# Patient Record
Sex: Male | Born: 1965 | Race: Black or African American | Hispanic: No | Marital: Married | Smoking: Former smoker
Health system: Southern US, Community
[De-identification: ages and names within clinical notes are randomized; demographics above are authoritative.]

## PROBLEM LIST (undated history)

## (undated) DIAGNOSIS — Z923 Personal history of irradiation: Secondary | ICD-10-CM

## (undated) DIAGNOSIS — R079 Chest pain, unspecified: Secondary | ICD-10-CM

## (undated) DIAGNOSIS — I10 Essential (primary) hypertension: Secondary | ICD-10-CM

## (undated) DIAGNOSIS — R002 Palpitations: Secondary | ICD-10-CM

## (undated) DIAGNOSIS — K219 Gastro-esophageal reflux disease without esophagitis: Secondary | ICD-10-CM

## (undated) DIAGNOSIS — C189 Malignant neoplasm of colon, unspecified: Secondary | ICD-10-CM

## (undated) DIAGNOSIS — J45909 Unspecified asthma, uncomplicated: Secondary | ICD-10-CM

---

## 2009-12-03 ENCOUNTER — Encounter: Payer: Self-pay | Admitting: Cardiovascular Disease

## 2010-05-24 ENCOUNTER — Encounter: Payer: Self-pay | Admitting: Cardiovascular Disease

## 2010-06-28 DIAGNOSIS — R079 Chest pain, unspecified: Secondary | ICD-10-CM

## 2010-06-28 DIAGNOSIS — R002 Palpitations: Secondary | ICD-10-CM

## 2010-06-28 DIAGNOSIS — R9431 Abnormal electrocardiogram [ECG] [EKG]: Secondary | ICD-10-CM | POA: Insufficient documentation

## 2010-06-28 HISTORY — DX: Palpitations: R00.2

## 2010-06-28 HISTORY — DX: Chest pain, unspecified: R07.9

## 2010-08-03 ENCOUNTER — Ambulatory Visit: Payer: Self-pay | Admitting: Cardiovascular Disease

## 2010-08-16 ENCOUNTER — Encounter: Payer: Self-pay | Admitting: Cardiovascular Disease

## 2010-08-16 ENCOUNTER — Ambulatory Visit (HOSPITAL_COMMUNITY): Admission: RE | Admit: 2010-08-16 | Discharge: 2010-08-16 | Payer: Self-pay | Admitting: Cardiovascular Disease

## 2010-08-16 ENCOUNTER — Ambulatory Visit: Payer: Self-pay | Admitting: Internal Medicine

## 2010-08-16 ENCOUNTER — Ambulatory Visit: Payer: Self-pay

## 2010-12-28 NOTE — Letter (Signed)
Summary: [facility]  [facility]   Imported By: Debby Freiberg 06/28/2010 11:00:26  _____________________________________________________________________  External Attachment:    Type:   Image     Comment:   External Document

## 2010-12-28 NOTE — Assessment & Plan Note (Signed)
Summary: NP3/ABN EKG/CHST PAIN   CC:  referal from Dr. Beverely Pace abnormal EKG.  History of Present Illness: Darren Hill is an engaging 45 yo blk male referred by Dr Beverely Pace for atypical sharp SSCP, palpitaoitns and abnormal ECG.  I reviewed his ECG form [facility] medical and the urgent care and they are essentially normal with minor T wave changes.  His SSCP was atypical and ppt by eating too much pepperment.  It is resolved.  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  No exertional pain and no risk factors.  He is interested in a career change either in cooking, cutting hair or automotive.  No associated diaphoresis, dyspnea syncope or edema.    Preventive Screening-Counseling & Management  Alcohol-Tobacco     Smoking Status: never      Drug Use:  no.    Current Problems (verified): 1)  Palpitations  (ICD-785.1) 2)  Chest Pain  (ICD-786.50) 3)  Abnormal Electrocardiogram  (ICD-794.31)  Current Medications (verified): 1)  None  Allergies (verified): No Known Drug Allergies  Past History:  Past Medical History: Last updated: 06/28/2010 PALPITATIONS CHEST PAIN that comes and goes...Marland KitchenMarland KitchenMarland Kitchenworse with deep breathe when present ABNORMAL ELECTROCARDIOGRAM   Family History: Last updated: 08/03/2010 DM  Social History: Last updated: 08/03/2010 Full Time Married  Tobacco Use - No.  Alcohol Use - no Drug Use - no  Past Surgical History: leg  Family History: DM  Social History: Full Time Married  Tobacco Use - No.  Alcohol Use - no Drug Use - no Smoking Status:  never Drug Use:  no  Review of Systems       Denies fever, malais, weight loss, blurry vision, decreased visual acuity, cough, sputum, SOB, hemoptysis, pleuritic pain, palpitaitons, heartburn, abdominal pain, melena, lower extremity edema, claudication, or rash.   Vital Signs:  Patient profile:   45 year old male Height:      69 inches Weight:      182 pounds BMI:     26.97 Pulse rate:   87 / minute Resp:     14  per minute BP sitting:   132 / 87  (left arm)  Vitals Entered By: Kem Parkinson (August 03, 2010 2:09 PM)  Physical Exam  General:  Affect appropriate Healthy:  appears stated age HEENT: normal Neck supple with no adenopathy JVP normal no bruits no thyromegaly Lungs clear with no wheezing and good diaphragmatic motion Heart:  S1/S2 no murmur,rub, gallop or click PMI normal Abdomen: benighn, BS positve, no tenderness, no AAA no bruit.  No HSM or HJR Distal pulses intact with no bruits No edema Neuro non-focal Skin warm and dry    Impression & Recommendations:  Problem # 1:  PALPITATIONS (ICD-785.1) Benign and no need for further w/u Orders: Echocardiogram (Echo)  Problem # 2:  CHEST PAIN (ICD-786.50) Atypical likely related to gi agravation from peppermint  No need for ETT  Problem # 3:  ABNORMAL ELECTROCARDIOGRAM (ICD-794.31) Minor changes.  F/U echo to assess LV/RV function  Patient Instructions: 1)  Your physician recommends that you schedule a follow-up appointment as needed. 2)  Your physician recommends that you continue on your current medications as directed. Please refer to the Current Medication list given to you today. 3)  Your physician has requested that you have an echocardiogram.  Echocardiography is a painless test that uses sound waves to create images of your heart. It provides your doctor with information about the size and shape of your heart and how  well your heart's chambers and valves are working.  This procedure takes approximately one hour. There are no restrictions for this procedure.   EKG Report  Procedure date:  07/08/2010  Findings:      NSR Nonspecific ST/T wave changes

## 2010-12-28 NOTE — Letter (Signed)
Summary: Encounter Report  Encounter Report   Imported By: Marylou Mccoy 10/26/2010 13:17:17  _____________________________________________________________________  External Attachment:    Type:   Image     Comment:   External Document

## 2011-03-05 ENCOUNTER — Emergency Department (HOSPITAL_COMMUNITY)
Admission: EM | Admit: 2011-03-05 | Discharge: 2011-03-05 | Disposition: A | Discharge status: Home / Self Care | Payer: PRIVATE HEALTH INSURANCE | Attending: Emergency Medicine | Admitting: Emergency Medicine

## 2011-03-05 ENCOUNTER — Emergency Department (HOSPITAL_COMMUNITY): Payer: PRIVATE HEALTH INSURANCE

## 2011-03-05 DIAGNOSIS — R079 Chest pain, unspecified: Secondary | ICD-10-CM | POA: Insufficient documentation

## 2011-03-05 LAB — CBC
HCT: 40.9 % (ref 39.0–52.0)
Hemoglobin: 14.5 g/dL (ref 13.0–17.0)
MCHC: 35.5 g/dL (ref 30.0–36.0)
RBC: 4.75 MIL/uL (ref 4.22–5.81)

## 2011-03-05 LAB — POCT I-STAT, CHEM 8
BUN: 10 mg/dL (ref 6–23)
Creatinine, Ser: 1.8 mg/dL — ABNORMAL HIGH (ref 0.4–1.5)
Hemoglobin: 14.6 g/dL (ref 13.0–17.0)
Potassium: 4.4 mEq/L (ref 3.5–5.1)
Sodium: 137 mEq/L (ref 135–145)

## 2011-03-05 LAB — DIFFERENTIAL
Basophils Absolute: 0 10*3/uL (ref 0.0–0.1)
Lymphocytes Relative: 46 % (ref 12–46)
Monocytes Absolute: 0.4 10*3/uL (ref 0.1–1.0)
Neutro Abs: 2.1 10*3/uL (ref 1.7–7.7)
Neutrophils Relative %: 44 % (ref 43–77)

## 2012-10-29 DIAGNOSIS — R7989 Other specified abnormal findings of blood chemistry: Secondary | ICD-10-CM | POA: Insufficient documentation

## 2014-04-01 ENCOUNTER — Other Ambulatory Visit: Payer: Self-pay | Admitting: Gastroenterology

## 2014-04-01 NOTE — Addendum Note (Signed)
Addended by: Hong Timm on: 04/01/2014 02:47 PM   Modules accepted: Orders  

## 2014-04-03 ENCOUNTER — Encounter (HOSPITAL_COMMUNITY)
Admission: RE | Disposition: A | Discharge status: Home / Self Care | Payer: Commercial Managed Care - PPO | Source: Ambulatory Visit | Attending: Gastroenterology

## 2014-04-03 ENCOUNTER — Ambulatory Visit (HOSPITAL_COMMUNITY)
Admission: RE | Admit: 2014-04-03 | Discharge: 2014-04-03 | Disposition: A | Discharge status: Home / Self Care | Payer: Commercial Managed Care - PPO | Source: Ambulatory Visit | Attending: Gastroenterology | Admitting: Gastroenterology

## 2014-04-03 ENCOUNTER — Encounter (HOSPITAL_COMMUNITY): Payer: Self-pay

## 2014-04-03 DIAGNOSIS — C20 Malignant neoplasm of rectum: Secondary | ICD-10-CM

## 2014-04-03 DIAGNOSIS — C189 Malignant neoplasm of colon, unspecified: Secondary | ICD-10-CM

## 2014-04-03 HISTORY — PX: COLON BIOPSY: SHX1369

## 2014-04-03 HISTORY — PX: EUS: SHX5427

## 2014-04-03 HISTORY — DX: Essential (primary) hypertension: I10

## 2014-04-03 HISTORY — DX: Malignant neoplasm of colon, unspecified: C18.9

## 2014-04-03 SURGERY — ULTRASOUND, LOWER GI TRACT, ENDOSCOPIC
Anesthesia: Moderate Sedation

## 2014-04-03 MED ORDER — FENTANYL CITRATE 0.05 MG/ML IJ SOLN
INTRAMUSCULAR | Status: AC
  Filled 2014-04-03: qty 2

## 2014-04-03 MED ORDER — MIDAZOLAM HCL 5 MG/ML IJ SOLN
INTRAMUSCULAR | Status: AC
  Filled 2014-04-03: qty 2

## 2014-04-03 MED ORDER — FENTANYL CITRATE 0.05 MG/ML IJ SOLN
INTRAMUSCULAR | Status: DC | PRN
  Administered 2014-04-03: 50 ug via INTRAVENOUS
  Administered 2014-04-03: 25 ug via INTRAVENOUS

## 2014-04-03 MED ORDER — MIDAZOLAM HCL 10 MG/2ML IJ SOLN
INTRAMUSCULAR | Status: DC | PRN
  Administered 2014-04-03 (×3): 2 mg via INTRAVENOUS

## 2014-04-03 MED ORDER — SODIUM CHLORIDE 0.9 % IV SOLN
INTRAVENOUS | Status: DC
  Administered 2014-04-03: 500 mL via INTRAVENOUS

## 2014-04-03 NOTE — Discharge Instructions (Signed)
Endorectal ultrasound ° °Post procedure instructions: ° °Read the instructions outlined below and refer to this sheet in the next few weeks. These discharge instructions provide you with general information on caring for yourself after you leave the hospital. Your doctor may also give you specific instructions. While your treatment has been planned according to the most current medical practices available, unavoidable complications occasionally occur. If you have any problems or questions after discharge, call Dr. Zarrah Loveland at [facility] ([phone]). ° °HOME CARE INSTRUCTIONS ° °ACTIVITY: °· You may resume your regular activity, but move at a slower pace for the next 24 hours.  °· Take frequent rest periods for the next 24 hours.  °· Walking will help get rid of the air and reduce the bloated feeling in your belly (abdomen).  °· No driving for 24 hours (because of the medicine (anesthesia) used during the test).  °· You may shower.  °· Do not sign any important legal documents or operate any machinery for 24 hours (because of the anesthesia used during the test).  °NUTRITION: °· Drink plenty of fluids.  °· You may resume your normal diet as instructed by your doctor.  °· Begin with a light meal and progress to your normal diet. Heavy or fried foods are harder to digest and may make you feel sick to your stomach (nauseated).  °· Avoid alcoholic beverages for 24 hours or as instructed.  °MEDICATIONS: °· You may resume your normal medications unless your doctor tells you otherwise.  °WHAT TO EXPECT TODAY: °· Some feelings of bloating in the abdomen.  °· Passage of more gas than usual.  °· Spotting of blood in your stool or on the toilet paper.  °IF YOU HAD POLYPS REMOVED DURING THE COLONOSCOPY: °· No aspirin products for 7 days or as instructed.  °· No alcohol for 7 days or as instructed.  °· Eat a soft diet for the next 24 hours.  ° °FINDING OUT THE RESULTS OF YOUR TEST ° °Not all test results are available  during your visit. If your test results are not back during the visit, make an appointment with your caregiver to find out the results. Do not assume everything is normal if you have not heard from your caregiver or the medical facility. It is important for you to follow up on all of your test results.  ° ° ° °SEEK IMMEDIATE MEDICAL CARE IF: ° °· You have more than a spotting of blood in your stool.  °· Your belly is swollen (abdominal distention).  °· You are nauseated or vomiting.  °· You have a fever.  °· You have abdominal pain or discomfort that is severe or gets worse throughout the day.  ° ° °Document Released: 06/28/2004 Document Revised: 07/27/2011 Document Reviewed: 06/26/2008 °ExitCare® Patient Information ©2012 [employer]. ° °

## 2014-04-03 NOTE — Op Note (Signed)
[facility] [place], [id number]   OPERATIVE PROCEDURE REPORT  PATIENT: Unnamed, Zeien  MR#: [id number] BIRTHDATE: 06/14/1966  GENDER: Male ENDOSCOPIST: Arta Silence, MD REFERRED BY:  Teena Irani, M.D. PROCEDURE DATE:  04/03/2014 PROCEDURE:   Flexible sigmoidoscopy EUS ASA CLASS:   Class II INDICATIONS:1.  rectal cancer. MEDICATIONS: Fentanyl 75 mcg IV and Versed 6 mg IV  DESCRIPTION OF PROCEDURE:   After the risks benefits and alternatives of the procedure were thoroughly explained, informed consent was obtained.  Throughout the procedure, the patients blood pressure, pulse and oxygen saturations were monitored continuously. Under direct visualization, the radial forward-viewing echoendoscope was introduced through the anus  and advanced to the sigmoid colon .  Water was used as necessary to provide an acoustic interface.  Imaging was obtained at 7.5 and 12Mhz. Upon completion of the imaging, water was removed and the patient was sent to the recovery room in satisfactory condition.    FINDINGS:   Normal digital rectal exam; could not palpate the mass. Mass readily seen endoscopically, is firm, fixed, fibrotic, friable, with central ulceration, located 10 - 15 cm from the anal verge.  Tattoo marking to identify the lesion's location was noted. Lesion appears posterior in location via ultrasound.  The mass is maximally about 3cm in diameter and less than 2cm thick.  Much of the lesion is confined to the rectal wall, but there is an approximately 30mm segment, corresponding endoscopically to the central ulceration, where the tumor penetrates through the muscularis propria.  There is no peritumoral adenopathy.  The lesion does not appear to invade into any neighboring organs.  STAGING: Posteriorally-located rectal cancer.  Locoregional staging by endorectal ultrasound is T3 N0 Mx.  RECOMMENDATIONS: 1.  Watch for potential  complications of procedure. 2.  Patient needs CT scan chest/abdomen/pelvis with contrast to complete staging evaluation. 3.  Based on ultrasound results alone, patient will need chemoradiative therapy prior to consideration of surgical intervention. 4.  Will discuss with Dr. Amedeo Plenty.   _______________________________ Lorrin MaisArta Silence, MD 04/03/2014 9:49 AM   CC:

## 2014-04-03 NOTE — H&P (Signed)
Patient interval history reviewed.  Patient examined again.  There has been no change from documented H/P dated 03/04/14 (scanned into chart from our office) except as documented above.  Assessment:  1.  Rectal mass, biopsy-proven adenocarcinoma.  Plan:  1.  Endorectal ultrasound for locoregional staging. 2.  Will ultimately need CT chest/abd/pelvis to complete staging evaluation. 3.  Risks (bleeding, infection, bowel perforation that could require surgery, sedation-related changes in cardiopulmonary systems), benefits (identification and possible treatment of source of symptoms, exclusion of certain causes of symptoms), and alternatives (watchful waiting, radiographic imaging studies, empiric medical treatment) of endorectal ultrasound (RUS) were explained to patient/family in detail and patient wishes to proceed.

## 2014-04-04 ENCOUNTER — Inpatient Hospital Stay: Admit: 2014-04-04 | Payer: Commercial Managed Care - PPO

## 2014-04-04 ENCOUNTER — Telehealth: Payer: Self-pay | Admitting: *Deleted

## 2014-04-04 ENCOUNTER — Encounter (HOSPITAL_COMMUNITY): Payer: Self-pay | Admitting: Gastroenterology

## 2014-04-04 ENCOUNTER — Other Ambulatory Visit: Payer: Self-pay | Admitting: Gastroenterology

## 2014-04-04 ENCOUNTER — Ambulatory Visit
Admission: RE | Admit: 2014-04-04 | Discharge: 2014-04-04 | Disposition: A | Discharge status: Home / Self Care | Payer: Commercial Managed Care - PPO | Source: Ambulatory Visit

## 2014-04-04 ENCOUNTER — Ambulatory Visit
Admission: RE | Admit: 2014-04-04 | Discharge: 2014-04-04 | Disposition: A | Discharge status: Home / Self Care | Payer: Commercial Managed Care - PPO | Source: Ambulatory Visit | Attending: Gastroenterology | Admitting: Gastroenterology

## 2014-04-04 DIAGNOSIS — C20 Malignant neoplasm of rectum: Secondary | ICD-10-CM

## 2014-04-04 MED ORDER — IOHEXOL 300 MG/ML  SOLN
100.0000 mL | Freq: Once | INTRAMUSCULAR | Status: AC | PRN
  Administered 2014-04-04: 100 mL via INTRAVENOUS

## 2014-04-04 NOTE — Telephone Encounter (Signed)
Spoke with patient by phone and gave appointments for Dr. Lisbeth Renshaw and Dr. Benay Spice.  Contact names, numbers, and directions were provided.

## 2014-04-04 NOTE — Progress Notes (Signed)
GU Location of Tumor / Histology: Rectal  Patient presented months ago with signs/symptoms of: blood uiin stools going on for less than  2 months  Biopsies of  (if applicable) revealed: Colonoscopy  With biopsy 04/03/14   : Colon, rectal mass biopsy= Adenocarcinoma   Dr. Arta Silence,  Past/Anticipated interventions by urology, if any:   colonoscopy 03/31/14 with biopsy Dr. Teena Irani  Past/Anticipated interventions by medical oncology, if any: Dr. Benay Spice seen 04/10/14, chemo education and Pamala Hurry Nef appts 04/15/14, Dr. Johney Maine 04/23/14  return appt with Dr.Sherrill  05/05/14  Weight changes, if any: no  Bowel/Bladder complaints, if any: rectal bleeding with bowel movements bid, regular formed  Nausea/Vomiting, if any: no  Pain issues, if any:  no  SAFETY ISSUES: No  Prior radiation? No  Pacemaker/ICD?  No   Is the patient on methotrexate? No  Current Complaints / other details: Truck driver, was in [employer],  Former smoker cigarettes quit 1779,TJ alcohol or illicit drug use  Hx of abnormal heart rhythm   Mother DM, Maternal aunt deceased age 17 breast ca,

## 2014-04-04 NOTE — Telephone Encounter (Signed)
[facility] spoke with Ailene Ravel asking for path from colonoscopy done 03/31/14, "It's not back yetwill fax when it is ready" 1:47 PM

## 2014-04-09 ENCOUNTER — Ambulatory Visit
Admission: RE | Admit: 2014-04-09 | Discharge: 2014-04-09 | Disposition: A | Discharge status: Home / Self Care | Payer: Commercial Managed Care - PPO | Source: Ambulatory Visit | Attending: Radiation Oncology | Admitting: Radiation Oncology

## 2014-04-09 ENCOUNTER — Ambulatory Visit: Payer: Commercial Managed Care - PPO

## 2014-04-09 DIAGNOSIS — C20 Malignant neoplasm of rectum: Secondary | ICD-10-CM | POA: Insufficient documentation

## 2014-04-09 DIAGNOSIS — Z510 Encounter for antineoplastic radiation therapy: Secondary | ICD-10-CM | POA: Insufficient documentation

## 2014-04-10 ENCOUNTER — Encounter: Payer: Self-pay | Admitting: Oncology

## 2014-04-10 ENCOUNTER — Ambulatory Visit (HOSPITAL_BASED_OUTPATIENT_CLINIC_OR_DEPARTMENT_OTHER): Payer: Commercial Managed Care - PPO | Admitting: Oncology

## 2014-04-10 ENCOUNTER — Telehealth: Payer: Self-pay | Admitting: Oncology

## 2014-04-10 ENCOUNTER — Ambulatory Visit: Payer: Commercial Managed Care - PPO

## 2014-04-10 VITALS — BP 125/81 | HR 86 | Temp 97.0°F | Resp 20 | Ht 69.0 in | Wt 183.2 lb

## 2014-04-10 DIAGNOSIS — C772 Secondary and unspecified malignant neoplasm of intra-abdominal lymph nodes: Secondary | ICD-10-CM

## 2014-04-10 DIAGNOSIS — I10 Essential (primary) hypertension: Secondary | ICD-10-CM

## 2014-04-10 DIAGNOSIS — C20 Malignant neoplasm of rectum: Secondary | ICD-10-CM

## 2014-04-10 NOTE — Telephone Encounter (Signed)
s.w. pt and advised on NUT appt 5.19.15.Marland KitchenMarland Kitchenpt ok adn aware

## 2014-04-10 NOTE — Telephone Encounter (Signed)
gv and printed appt sched and avs for pt for May June.Darren KitchenMarland Kitchenpt wanted late afternoon appt...done

## 2014-04-10 NOTE — Progress Notes (Signed)
Checked in new pt with no financial concerns. °

## 2014-04-10 NOTE — Progress Notes (Signed)
Met with Darren Hill and family. Explained role of nurse navigator. Educational information provided on colorectal cancer  Referral made to dietician for diet education. [place] resources provided to patient, including SW service and support group information.  Contact names and phone numbers were provided for entire [facility] team.  Teach back method was used.  No barriers to care identified.  Will continue to follow as needed.

## 2014-04-10 NOTE — Progress Notes (Signed)
[facility] Patient Consult   Referring MD: Missy Sabins, Md [address], [place], [place] [address]   Darren Hill 48 y.o.  12-25-1965    Reason for Referral: Rectal cancer   HPI: He reports blood in the stool for a few weeks and he was referred to Dr. Amedeo Plenty. He had noted a small amount of blood per rectum in 2014. He otherwise feels well.  He was taken to a colonoscopy by Dr. Amedeo Plenty on 03/31/2014. A sessile nonobstructing mass was found in the proximal rectum from 12-15 cm from the anal verge. Oozing was present. A biopsy was obtained. The area was tattooed. The exam was otherwise normal. The pathology ([id number]) revealed adenocarcinoma.  Dr. Paulita Fujita performed an endoscopic ultrasound 04/03/2012. The mass was measured at 10-15 cm from the anal verge. The mass was largely confined to the rectal wall, but an approximate 15 mm segment corresponded to the area of central ulceration where tumor was noted to penetrate through the muscularis propria. No peritumoral adenopathy. The tumor was staged as an ultrasound T3 N0 lesion.  He underwent staging CTs of the chest, abdomen, and pelvis on 04/04/2014. No suspicious pulmonary nodule. The liver appeared normal. An intraluminal mass was noted in the superior rectum measuring approximately 2.1 x 2.9 cm. No evidence of perirectal or other pelvic lymphadenopathy. No abdominal lymphadenopathy.    Past Medical History  Diagnosis Date  . Hypertension     Past Surgical History  Procedure Laterality Date  . Eus N/A 04/03/2014    Procedure: LOWER ENDOSCOPIC ULTRASOUND (EUS);  Surgeon: Arta Silence, MD;  Location: [facility] ENDOSCOPY;  Service: Endoscopy;  Laterality: N/A;  H/P in file cabinet ja    Medications: Reviewed  Allergies: No Known Allergies  Family history: No family history of cancer. He has 4 sisters.  Social History:   He lives in [place]. He works as a Administrator. He quit smoking cigarettes 8  years ago. He does not use alcohol. He was in the [employer]. No risk factor for HIV or hepatitis.  History  Alcohol Use No    History  Smoking status  . Former Smoker  . Quit date: 04/03/2006  Smokeless tobacco  . Not on file      ROS:   Positives include: Rectal bleeding with bowel movements  A complete ROS was otherwise negative.  Physical Exam:  Blood pressure 125/81, pulse 86, temperature 97 F (36.1 C), temperature source Oral, resp. rate 20, height 5\' 9"  (1.753 m), weight 183 lb 3.2 oz (83.099 kg), SpO2 100.00%.  HEENT: Oropharynx without visible mass, neck without mass Lungs: Clear bilaterally Cardiac: Regular rate and rhythm Abdomen: No hepatosplenomegaly, nontender, no mass GU: Testes without mass  Vascular: No leg edema Lymph nodes: No cervical, supra-clavicular, axillary, or inguinal nodes Neurologic: Alert and oriented, the motor exam appears intact in the upper and lower extremities Skin: No rash Musculoskeletal: No spine tenderness   LAB: CBC, chemistry panel, and CEA to be obtained on the day of the chemotherapy teaching class  Imaging:  As per history of present illness   Assessment/Plan:   1. Clinical stage II Rectal cancer, posterior rectal mass identified at 10-15 cm from the anal verge, a biopsy 03/31/2014 confirmed adenocarcinoma  Endoscopic ultrasound 04/03/2014 revealed a (uT3,uN0) lesion 2. Hypertension   Disposition:   Mr. Keeter has been diagnosed with adenocarcinoma of the rectum. I discussed the diagnosis and treatment options with Mr. Chasteen and his wife. He appears  to have localized disease. The standard of care is to proceed with neoadjuvant chemotherapy and radiation to be followed by surgery.  We will refer him to Dr. Lisbeth Renshaw and Dr. Johney Maine.  I recommend neoadjuvant capecitabine and radiation. We discussed the potential toxicities associated with capecitabine including the chance for mucositis, diarrhea, and hematologic toxicity. We  reviewed the rash, hyperpigmentation, and hand/foot syndrome associated with capecitabine. We also discussed increased sun sensitivity and the likelihood of a radiation/chemotherapy skin reaction. He agrees to proceed.  The plan is to begin combined modality therapy during the week of 04/21/2014. Mr. Bhardwaj will return for an office and lab visit 05/05/2014.  Approximately 50 minutes were spent with patient today. The majority of the time was used for counseling and coordination of care.  Ladell Pier 04/10/2014, 10:58 AM

## 2014-04-11 ENCOUNTER — Other Ambulatory Visit: Payer: Self-pay | Admitting: *Deleted

## 2014-04-11 MED ORDER — CAPECITABINE 500 MG PO TABS: ORAL_TABLET | ORAL | Status: DC

## 2014-04-14 ENCOUNTER — Encounter: Payer: Self-pay | Admitting: Radiation Oncology

## 2014-04-14 ENCOUNTER — Ambulatory Visit
Admission: RE | Admit: 2014-04-14 | Discharge: 2014-04-14 | Disposition: A | Discharge status: Home / Self Care | Payer: Commercial Managed Care - PPO | Source: Ambulatory Visit | Attending: Radiation Oncology | Admitting: Radiation Oncology

## 2014-04-14 ENCOUNTER — Other Ambulatory Visit: Payer: Self-pay | Admitting: *Deleted

## 2014-04-14 ENCOUNTER — Encounter: Payer: Self-pay | Admitting: Oncology

## 2014-04-14 VITALS — BP 120/78 | HR 84 | Temp 97.6°F | Resp 20 | Ht 69.0 in | Wt 184.0 lb

## 2014-04-14 DIAGNOSIS — C20 Malignant neoplasm of rectum: Secondary | ICD-10-CM

## 2014-04-14 HISTORY — DX: Malignant neoplasm of colon, unspecified: C18.9

## 2014-04-14 MED ORDER — CAPECITABINE 500 MG PO TABS: ORAL_TABLET | ORAL | Status: DC

## 2014-04-14 NOTE — Progress Notes (Signed)
Faxed xeloda prescription to [facility] °

## 2014-04-14 NOTE — Progress Notes (Signed)
Per patient maternal aunt had brain tumor not breast cancer, deceased, addendum

## 2014-04-14 NOTE — Progress Notes (Signed)
Please see the Nurse Progress Note in the MD Initial Consult Encounter for this patient. 

## 2014-04-15 ENCOUNTER — Other Ambulatory Visit (HOSPITAL_BASED_OUTPATIENT_CLINIC_OR_DEPARTMENT_OTHER): Payer: Commercial Managed Care - PPO

## 2014-04-15 ENCOUNTER — Encounter: Payer: Self-pay | Admitting: *Deleted

## 2014-04-15 ENCOUNTER — Ambulatory Visit: Payer: Commercial Managed Care - PPO | Admitting: Nutrition

## 2014-04-15 ENCOUNTER — Other Ambulatory Visit: Payer: Commercial Managed Care - PPO

## 2014-04-15 DIAGNOSIS — C20 Malignant neoplasm of rectum: Secondary | ICD-10-CM

## 2014-04-15 LAB — CBC WITH DIFFERENTIAL/PLATELET
BASO%: 1 % (ref 0.0–2.0)
Basophils Absolute: 0.1 10*3/uL (ref 0.0–0.1)
EOS%: 4.3 % (ref 0.0–7.0)
Eosinophils Absolute: 0.3 10*3/uL (ref 0.0–0.5)
HCT: 40.2 % (ref 38.4–49.9)
HGB: 13.2 g/dL (ref 13.0–17.1)
LYMPH%: 43.1 % (ref 14.0–49.0)
MCH: 28.8 pg (ref 27.2–33.4)
MCHC: 32.9 g/dL (ref 32.0–36.0)
MCV: 87.5 fL (ref 79.3–98.0)
MONO#: 0.5 10*3/uL (ref 0.1–0.9)
MONO%: 8.5 % (ref 0.0–14.0)
NEUT#: 2.7 10*3/uL (ref 1.5–6.5)
NEUT%: 43.1 % (ref 39.0–75.0)
PLATELETS: 255 10*3/uL (ref 140–400)
RBC: 4.59 10*6/uL (ref 4.20–5.82)
RDW: 12.6 % (ref 11.0–14.6)
WBC: 6.3 10*3/uL (ref 4.0–10.3)
lymph#: 2.7 10*3/uL (ref 0.9–3.3)

## 2014-04-15 LAB — COMPREHENSIVE METABOLIC PANEL (CC13)
ALBUMIN: 3.9 g/dL (ref 3.5–5.0)
ALT: 9 U/L (ref 0–55)
AST: 17 U/L (ref 5–34)
Alkaline Phosphatase: 48 U/L (ref 40–150)
Anion Gap: 10 mEq/L (ref 3–11)
BUN: 13.4 mg/dL (ref 7.0–26.0)
CALCIUM: 9.3 mg/dL (ref 8.4–10.4)
CHLORIDE: 104 meq/L (ref 98–109)
CO2: 25 mEq/L (ref 22–29)
Creatinine: 1.4 mg/dL — ABNORMAL HIGH (ref 0.7–1.3)
Glucose: 100 mg/dl (ref 70–140)
POTASSIUM: 3.9 meq/L (ref 3.5–5.1)
Sodium: 139 mEq/L (ref 136–145)
Total Bilirubin: 0.34 mg/dL (ref 0.20–1.20)
Total Protein: 7 g/dL (ref 6.4–8.3)

## 2014-04-15 NOTE — Progress Notes (Signed)
Radiation Oncology         [phone] ________________________________  Name: Darren Hill MRN: [id number]  Date: 04/14/2014  DOB: [birth year]  CC:Pcp Not In System  Darren Pier, MD     REFERRING PHYSICIAN: Ladell Pier, MD   DIAGNOSIS: The encounter diagnosis was Rectal cancer.   HISTORY OF PRESENT ILLNESS::Darren Hill is a 48 y.o. male who is seen for an initial consultation visit. The patient indicates he noticed blood in his stool for quite a while. He was unable to give a specific time frame but feels it has been somewhat of a chronic issue. The patient was referred to GI medicine and a colonoscopy was performed on 03/31/2014. A sessile non-obstructing mass was present within the proximal rectum at the 12-15 cm location from the anal verge. Some bleeding was present. A biopsy was obtained and pathology has revealed adenocarcinoma.  The patient proceeded to undergo an endoscopic ultrasound on 04/03/2014. This showed a mass which was firm and friable with central ulceration. This was located at the 10-15 cm distance from the anal verge. Endoscopically this corresponded to a T3 N0 tumor.  The patient has undergone CT scans of the chest abdomen and pelvis on 04/04/2014. No sign of pulmonary disease. A mass was present within the proximal rectum measuring 2.9 cm. No pelvic or abdominal lymphadenopathy was present.  The patient has seen Dr. Benay Hill in medical oncology and I have been asked to see the patient for consideration of radiation treatment as part of his overall treatment plan. The patient is also scheduled to see Dr. Johney Hill next week regarding surgery.    PREVIOUS RADIATION THERAPY: No   PAST MEDICAL HISTORY:  has a past medical history of Hypertension and Colon cancer (04/03/14).     PAST SURGICAL HISTORY: Past Surgical History  Procedure Laterality Date  . Eus N/A 04/03/2014    Procedure: LOWER ENDOSCOPIC ULTRASOUND (EUS);  Surgeon: Darren Silence, MD;  Location:  [facility] ENDOSCOPY;  Service: Endoscopy;  Laterality: N/A;  H/P in file cabinet ja  . Colon biopsy N/A 04/03/14    rectal mass=adenocarcinoma     FAMILY HISTORY: family history includes Brain cancer in his maternal aunt.   SOCIAL HISTORY:  reports that he quit smoking about 8 years ago. His smoking use included Cigarettes. He has a 3.75 pack-year smoking history. He has never used smokeless tobacco. He reports that he does not drink alcohol or use illicit drugs.   ALLERGIES: Review of patient's allergies indicates no known allergies.   MEDICATIONS:  Current Outpatient Prescriptions  Medication Sig Dispense Refill  . lisinopril (PRINIVIL,ZESTRIL) 10 MG tablet Take 10 mg by mouth daily.      . capecitabine (XELODA) 500 MG tablet Take 4 tablets (= 2000 mg) in the AM; Take 3 tablets (=1500 mg) in the PM.  Total daily dose of 3500 mg.  Take on days of Radiation Only.  140 tablet  0   No current facility-administered medications for this encounter.     REVIEW OF SYSTEMS:  A 15 point review of systems is documented in the electronic medical record. This was obtained by the nursing staff. However, I reviewed this with the patient to discuss relevant findings and make appropriate changes.  Pertinent items are noted in HPI.    PHYSICAL EXAM:  height is 5\' 9"  (1.753 m) and weight is 184 lb (83.462 kg). His oral temperature is 97.6 F (36.4 C). His blood pressure is 120/78 and his pulse is 84. His  respiration is 20.   ECOG = 1  0 - Asymptomatic (Fully active, able to carry on all predisease activities without restriction)  1 - Symptomatic but completely ambulatory (Restricted in physically strenuous activity but ambulatory and able to carry out work of a light or sedentary nature. For example, light housework, office work)  2 - Symptomatic, <50% in bed during the day (Ambulatory and capable of all self care but unable to carry out any work activities. Up and about more than 50% of waking hours)  3  - Symptomatic, >50% in bed, but not bedbound (Capable of only limited self-care, confined to bed or chair 50% or more of waking hours)  4 - Bedbound (Completely disabled. Cannot carry on any self-care. Totally confined to bed or chair)  5 - Death   Darren Hill MM, Darren Hill, Darren Hill, et al. [phone]). "Toxicity and response criteria of the [facility]". Darren Hill. 5 (6): 649-55  General: Well-developed, in no acute distress HEENT: Normocephalic, atraumatic; oral cavity clear Neck: Supple without any lymphadenopathy Cardiovascular: Regular rate and rhythm Respiratory: Clear to auscultation bilaterally GI: Soft, nontender, normal bowel sounds Extremities: No edema present Neuro: No focal deficits Rectal:  No external lesions. No rectal mass palpated. No blood on exam glove.    LABORATORY DATA:  Lab Results  Component Value Date   WBC 4.8 03/05/2011   HGB 14.6 03/05/2011   HCT 43.0 03/05/2011   MCV 86.1 03/05/2011   PLT 241 03/05/2011   Lab Results  Component Value Date   NA 137 03/05/2011   K 4.4 03/05/2011   CL 106 03/05/2011   No results found for this basename: ALT, AST, GGT, ALKPHOS, BILITOT      RADIOGRAPHY: Ct Chest W Contrast  04/04/2014   CLINICAL DATA:  Newly diagnosed rectal carcinoma.  EXAM: CT CHEST, ABDOMEN, AND PELVIS WITH CONTRAST  TECHNIQUE: Multidetector CT imaging of the chest, abdomen and pelvis was performed following the standard protocol during bolus administration of intravenous contrast.  CONTRAST:  147mL OMNIPAQUE IOHEXOL 300 MG/ML  SOLN  COMPARISON:  None.  FINDINGS: CT CHEST FINDINGS  No evidence of mediastinal or hilar masses. No lymphadenopathy seen elsewhere within the thorax. No evidence of pleural or pericardial effusion.  No suspicious pulmonary nodules or masses are identified. Mild scarring noted in the inferior aspect of the lingula. No evidence of pulmonary airspace disease or central endobronchial lesion. No evidence of chest wall  mass or suspicious bone lesions.  CT ABDOMEN AND PELVIS FINDINGS  The liver, gallbladder, pancreas, spleen, adrenal glands, and kidneys are normal in appearance. No evidence of hydronephrosis.  An intraluminal mass is seen in the superior rectum measuring approximately 2.1 x 2.9 cm on image 103. No evidence of perirectal or other pelvic lymphadenopathy. No evidence of abdominal lymphadenopathy. No other soft tissue masses identified. No evidence of bowel obstruction.  No evidence of inflammatory process or abnormal fluid collections. No suspicious bone lesions identified.  IMPRESSION: 2.9 cm intraluminal mass in the superior rectum, consistent with known rectal carcinoma.  No evidence of local lymphadenopathy or distant metastatic disease.   Electronically Signed   By: Earle Gell M.D.   On: 04/04/2014 18:13   Ct Abdomen Pelvis W Contrast  04/04/2014   CLINICAL DATA:  Newly diagnosed rectal carcinoma.  EXAM: CT CHEST, ABDOMEN, AND PELVIS WITH CONTRAST  TECHNIQUE: Multidetector CT imaging of the chest, abdomen and pelvis was performed following the standard protocol during bolus administration of intravenous  contrast.  CONTRAST:  177mL OMNIPAQUE IOHEXOL 300 MG/ML  SOLN  COMPARISON:  None.  FINDINGS: CT CHEST FINDINGS  No evidence of mediastinal or hilar masses. No lymphadenopathy seen elsewhere within the thorax. No evidence of pleural or pericardial effusion.  No suspicious pulmonary nodules or masses are identified. Mild scarring noted in the inferior aspect of the lingula. No evidence of pulmonary airspace disease or central endobronchial lesion. No evidence of chest wall mass or suspicious bone lesions.  CT ABDOMEN AND PELVIS FINDINGS  The liver, gallbladder, pancreas, spleen, adrenal glands, and kidneys are normal in appearance. No evidence of hydronephrosis.  An intraluminal mass is seen in the superior rectum measuring approximately 2.1 x 2.9 cm on image 103. No evidence of perirectal or other pelvic  lymphadenopathy. No evidence of abdominal lymphadenopathy. No other soft tissue masses identified. No evidence of bowel obstruction.  No evidence of inflammatory process or abnormal fluid collections. No suspicious bone lesions identified.  IMPRESSION: 2.9 cm intraluminal mass in the superior rectum, consistent with known rectal carcinoma.  No evidence of local lymphadenopathy or distant metastatic disease.   Electronically Signed   By: Earle Gell M.D.   On: 04/04/2014 18:13       IMPRESSION: The patient is a pleasant 48 year old gentleman with a new diagnosis of adenocarcinoma of the rectum. On endoscopic ultrasound this was present beginning 10 cm from the anal verge and represents a T3, N0, M0 tumor. The patient is presenting with a favorable case. In reviewing the patient's endoscopy report and CT imaging, I would consider this a proximal rectal tumor which would likely benefit from neoadjuvant chemoradiation treatment. The patient is young and I would favor aggressive treatment in his case.  I therefore discussed with the patient a potential 5-1/2 week course of radiation treatment with concurrent chemotherapy. I discussed with him the rationale of this treatment prior to surgery. We discussed the expected improvement in local/regional control. He also discussed the possible side effects and risks of treatment. All of his questions were answered.   PLAN: The patient will be scheduled for a simulation in the near future such that we can begin treatment planning. He has organ scheduled for chemotherapy class and also is seeing the nutritionist in the near future. The patient also will see Dr. Johney Hill next week regarding surgery.    ________________________________   Jodelle Gross, MD, PhD   **Disclaimer: This note was dictated with voice recognition software. Similar sounding words can inadvertently be transcribed and this note may contain transcription errors which may not have been corrected  upon publication of note.**

## 2014-04-15 NOTE — Progress Notes (Signed)
Patient is a 48 year old male diagnosed with rectal cancer.  He is a patient of Dr. Benay Spice.  Past medical history includes hypertension.  Medications include Xeloda.  Labs were pending.  Height: 69 inches. Weight: 184 pounds May 18. Usual body weight: 185 pounds. BMI: 27.16.  Patient will receive oral chemotherapy along with radiation therapy.  He does not present with any nutrition problems.  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  He likes most foods.  Nutrition diagnosis: Food and nutrition related knowledge deficit related to new diagnosis of rectal cancer as evidenced by no prior need for nutrition related information.  Intervention: Patient educated to consume 3 meals with snacks in between to promote weight maintenance.  Educated patient on high-protein foods and encouraged patient to consume at meals and snacks.  Discussed snacks patient could have with him on on the go while he is working.  Briefly reviewed strategies if patient develops diarrhea.  Provided fact sheets on increasing calories and protein and diarrhea.  Questions were answered.  Teach back method used.  Monitoring, evaluation, goals: Patient will tolerate adequate calories and protein for weight maintenance throughout treatment.    Next visit: Patient will contact me for followup if needed.

## 2014-04-15 NOTE — Progress Notes (Signed)
RECEIVED A FAX FROM [facility]ERNING A NOTICE OF RX TRANSFER FOR CAPECITABINE TO [facility]. PHARMACY PHONE #[phone]/FAX [phone].

## 2014-04-16 LAB — CEA: CEA: 1.2 ng/mL (ref 0.0–5.0)

## 2014-04-23 ENCOUNTER — Ambulatory Visit (INDEPENDENT_AMBULATORY_CARE_PROVIDER_SITE_OTHER): Payer: Commercial Managed Care - PPO | Admitting: Surgery

## 2014-04-23 ENCOUNTER — Encounter (INDEPENDENT_AMBULATORY_CARE_PROVIDER_SITE_OTHER): Payer: Self-pay | Admitting: Surgery

## 2014-04-23 ENCOUNTER — Ambulatory Visit
Admission: RE | Admit: 2014-04-23 | Discharge: 2014-04-23 | Disposition: A | Discharge status: Home / Self Care | Payer: Commercial Managed Care - PPO | Source: Ambulatory Visit | Attending: Radiation Oncology | Admitting: Radiation Oncology

## 2014-04-23 VITALS — BP 120/90 | HR 80 | Temp 97.2°F | Resp 16 | Ht 69.0 in | Wt 182.2 lb

## 2014-04-23 DIAGNOSIS — C20 Malignant neoplasm of rectum: Secondary | ICD-10-CM

## 2014-04-23 MED ORDER — METRONIDAZOLE 500 MG PO TABS: 500.0000 mg | ORAL_TABLET | ORAL | Status: DC

## 2014-04-23 MED ORDER — NEOMYCIN SULFATE 500 MG PO TABS: 1000.0000 mg | ORAL_TABLET | ORAL | Status: DC

## 2014-04-23 NOTE — Patient Instructions (Addendum)
Please consider the recommendations that we have given you today:  Usual benefit from neoadjuvant chemoradiation therapy to help shrink the rectal cancer.  8-12 weeks after completing neoadjuvant chemoradiation therapy, you will benefit from resection of the rectal cancer with an operation (LOW ANTERIOR RESECTION).  Plan robotic/minimally invasive approach.  See the Handout(s) we have given you.  Please call our office at [phone] if you wish to schedule surgery or if you have further questions / concerns.    Colorectal Cancer Colorectal cancer is an abnormal growth of tissue (tumor) in the colon or rectum that is cancerous (malignant). Unlike noncancerous (benign) tumors, malignant tumors can spread to other parts of your body. The colon is the large bowel or large intestine. The rectum is the last several inches of the colon.  RISK FACTORS The exact cause of colorectal cancer is unknown. However, the following factors may increase your chances of getting colorectal cancer:   Age older than 52 years.   Abnormal growths (polyps) on the inner wall of the colon or rectum.   Diabetes.   African American race.   Family history of hereditary nonpolyposis colorectal cancer. This condition is caused by changes in the genes that are responsible for repairing mismatched DNA.   Personal history of cancer. A person who has already had colorectal cancer may develop it a second time. Also, women with a history of ovarian, uterine, or breast cancer are at a somewhat higher risk of developing colorectal cancer.  Certain hereditary conditions.  Eating a diet that is high in fat (especially animal fat) and low in fiber, fruits, and vegetables.  Sedentary lifestyle.  Inflammatory bowel disease, including ulcerative colitis and Crohn disease.   Smoking.   Excessive alcohol use.  SYMPTOMS Early colorectal cancer often does not cause symptoms. As the cancer grows, symptoms may  include:   Changes in bowel habits.  Diarrhea.   Constipation.   Feeling like the bowel does not empty completely after a bowel movement.   Blood in the stool.   Stools that are narrower than usual.   Abdominal discomfort, pain, bloating, fullness, or cramps.  Frequent gas pain.   Unexplained weight loss.   Constant tiredness.   Nausea and vomiting.  DIAGNOSIS  Your health care provider will ask about your medical history. He or she may also perform a number of procedures, such as:   A physical exam.  A digital rectal exam.  A fecal occult blood test.  A barium enema.  Blood tests.   X-rays.   Imaging tests, such as CT scans or MRIs.   Taking a tissue sample (biopsy) from your colon or rectum to look for cancer cells.   A sigmoidoscopy to view the inside of the last part of your colon.   A colonoscopy to view the inside of your entire colon.   An endorectal ultrasound to see how deep a rectal tumor has grown and whether the cancer has spread to lymph nodes or other nearby tissues.  Your cancer will be staged to determine its severity and extent. Staging is a careful attempt to find out the size of the tumor, whether the cancer has spread, and if so, to what parts of the body. You may need to have more tests to determine the stage of your cancer. The test results will help determine what treatment plan is best for you.   Stage 0 The cancer is found only in the innermost lining of the colon or rectum.  Stage I The cancer has grown into the inner wall of the colon or rectum. The cancer has not yet reached the outer wall of the colon.   Stage II The cancer extends more deeply into or through the wall of the colon or rectum. It may have invaded nearby tissue, but cancer cells have not spread to the lymph nodes.   Stage III The cancer has spread to nearby lymph nodes but not to other parts of the body.   Stage IV The cancer has spread to other  parts of the body, such as the liver or lungs.  Your health care provider may tell you the detailed stage of your cancer, which includes both a number and a letter.  TREATMENT  Depending on the type and stage, colorectal cancer may be treated with surgery, radiation therapy, chemotherapy, targeted therapy, or radiofrequency ablation. Some people have a combination of these therapies. Surgery may be done to remove the polyps from your colon. In early stages, your health care provider may be able to do this during a colonoscopy. In later stages, surgery may be done to remove part of your colon.  HOME CARE INSTRUCTIONS   Only take over-the-counter or prescription medicines for pain, discomfort, or fever as directed by your health care provider.   Maintain a healthy diet.   Consider joining a support group. This may help you learn to cope with the stress of having colorectal cancer.   Seek advice to help you manage treatment of side effects.   Keep all follow-up appointments as directed by your health care provider.   Inform your cancer specialist if you are admitted to the hospital.  SEEK MEDICAL CARE IF:  Your diarrhea or constipation does not go away.   Your bowel habits change.  You have increased abdominal pain.   You notice new fatigue or weakness.  You lose weight. Document Released: 11/14/2005 Document Revised: 07/17/2013 Document Reviewed: 05/09/2013 [facility] Patient Information 2014 [place].  ABDOMINAL SURGERY: POST OP INSTRUCTIONS  1. DIET: Follow a light bland diet the first 24 hours after arrival home, such as soup, liquids, crackers, etc.  Be sure to include lots of fluids daily.  Avoid fast food or heavy meals as your are more likely to get nauseated.  Eat a low fat the next few days after surgery.   2. Take your usually prescribed home medications unless otherwise directed. 3. PAIN CONTROL: a. Pain is best controlled by a usual combination of three  different methods TOGETHER: i. Ice/Heat ii. Over the counter pain medication iii. Prescription pain medication b. Most patients will experience some swelling and bruising around the incisions.  Ice packs or heating pads (30-60 minutes up to 6 times a day) will help. Use ice for the first few days to help decrease swelling and bruising, then switch to heat to help relax tight/sore spots and speed recovery.  Some people prefer to use ice alone, heat alone, alternating between ice & heat.  Experiment to what works for you.  Swelling and bruising can take several weeks to resolve.   c. It is helpful to take an over-the-counter pain medication regularly for the first few weeks.  Choose one of the following that works best for you: i. Naproxen (Aleve, etc)  Two 274m tabs twice a day ii. Ibuprofen (Advil, etc) Three 2071mtabs four times a day (every meal & bedtime) iii. Acetaminophen (Tylenol, etc) 500-6502mour times a day (every meal & bedtime) d. A  prescription  for pain medication (such as oxycodone, hydrocodone, etc) should be given to you upon discharge.  Take your pain medication as prescribed.  i. If you are having problems/concerns with the prescription medicine (does not control pain, nausea, vomiting, rash, itching, etc), please call us [phone] to see if we need to switch you to a different pain medicine that will work better for you and/or control your side effect better. ii. If you need a refill on your pain medication, please contact your pharmacy.  They will contact our office to request authorization. Prescriptions will not be filled after 5 pm or on week-ends. 4. Avoid getting constipated.  Between the surgery and the pain medications, it is common to experience some constipation.  Increasing fluid intake and taking a fiber supplement (such as Metamucil, Citrucel, FiberCon, MiraLax, etc) 1-2 times a day regularly will usually help prevent this problem from occurring.  A mild laxative  (prune juice, Milk of Magnesia, MiraLax, etc) should be taken according to package directions if there are no bowel movements after 48 hours.   5. Watch out for diarrhea.  If you have many loose bowel movements, simplify your diet to bland foods & liquids for a few days.  Stop any stool softeners and decrease your fiber supplement.  Switching to mild anti-diarrheal medications (Kayopectate, Pepto Bismol) can help.  If this worsens or does not improve, please call us. 6. Wash / shower every day.  You may shower over the incision / wound.  Avoid baths until the skin is fully healed.  Continue to shower over incision(s) after the dressing is off. 7. Remove your waterproof bandages 5 days after surgery.  You may leave the incision open to air.  You may replace a dressing/Band-Aid to cover the incision for comfort if you wish. 8. ACTIVITIES as tolerated:   a. You may resume regular (light) daily activities beginning the next day-such as daily self-care, walking, climbing stairs-gradually increasing activities as tolerated.  If you can walk 30 minutes without difficulty, it is safe to try more intense activity such as jogging, treadmill, bicycling, low-impact aerobics, swimming, etc. b. Save the most intensive and strenuous activity for last such as sit-ups, heavy lifting, contact sports, etc  Refrain from any heavy lifting or straining until you are off narcotics for pain control.   c. DO NOT PUSH THROUGH PAIN.  Let pain be your guide: If it hurts to do something, don't do it.  Pain is your body warning you to avoid that activity for another week until the pain goes down. d. You may drive when you are no longer taking prescription pain medication, you can comfortably wear a seatbelt, and you can safely maneuver your car and apply brakes. e. Dennis Bast may have sexual intercourse when it is comfortable.  9. FOLLOW UP in our office a. Please call [facility] at [phone]) [phone] to set up an appointment to see your surgeon in the  office for a follow-up appointment approximately 1-2 weeks after your surgery. b. Make sure that you call for this appointment the day you arrive home to insure a convenient appointment time. 10. IF YOU HAVE DISABILITY OR FAMILY LEAVE FORMS, BRING THEM TO THE OFFICE FOR PROCESSING.  DO NOT GIVE THEM TO YOUR DOCTOR.   WHEN TO CALL us [phone]: 1. Poor pain control 2. Reactions / problems with new medications (rash/itching, nausea, etc)  3. Fever over 101.5 F (38.5 C) 4. Inability to urinate 5. Nausea and/or vomiting 6. Worsening swelling  or bruising 7. Continued bleeding from incision. 8. Increased pain, redness, or drainage from the incision  The clinic staff is available to answer your questions during regular business hours (8:30am-5pm).  Please don't hesitate to call and ask to speak to one of our nurses for clinical concerns.   A surgeon from [facility] is always on call at the hospitals   If you have a medical emergency, go to the nearest emergency room or call 911.    [facility], [address], [place], [place], [place]  [address] ? MAIN: [phone] ? TOLL FREE: [phone] ? FAX [phone] [email or web address]   COLON PREP INSTRUCTIONS for lower/distal colectomy:   Obtain what you need at a pharmacy of your choice:      Prescriptions for your oral antibiotics (Neomycin & Metronidazole)     A bottle of Milk of Magnesia    2 Fleet enemas (generic form OK to use)   DAY PRIOR TO SURGERY:    1:00pm    o Take 2 oz (4 tablespoons) Milk of Magnesia. o Take 2 Neomycin 57m tablets & 2 Metronidazole 5053mtablets     3:00pm:    o Take 2 Neomycin 50061mablets & 2 Metronidazole 500m38mblets     Bedtime (~10:00pm)  o Take 2 Neomycin 500mg62mlets & 2 Metronidazole 500mg 55mets    Midnight:  Do not eat or drink anything after midnight the night before your surgery.   MORNING OF PROCEDURE:      Remember to not to drink or eat anything that morning    Upon waking up, take the 2 Fleet enemas.  o Use at least 1 hour before leaving house o Try to retain each enema for 5-10 minutes before expelling it.  This should clean your lower colon sufficiently   If you have questions or problems, please call [facility] speak to someone in the clinic department at our office    Ostomy Support Information  Yes, you've heard that people get along just fine with only one of their eyes, or one of their lungs, or one of their kidneys. But you also know that you have only one intestine and only one bladder, and that leaves you feeling awfully empty, both physically and emotionally: You think no other people go around without part of their intestine with the ends of their intestines sticking out through their abdominal walls.  Well, you are wrong! There are nearly three quarters of a million people in the [place] who[place] an [place]; people who have had surgery to remove all or part of their colons or bladders. There is even a national association, the [place] 350 local affiliated support groups that are organized by volunteers who provide peer support and counseling. UOAA hJuan Quam toll free telephone num-ber, 800-82204-582-7081n educational,  interactive website, www.ostomy.org   An ostomy is an opening in the belly (abdominal wall) made by surgery. Ostomates are people who have had this procedure. The opening (stoma) allows the kidney or bowel to discharge waste. An external pouch covers the stoma to collect waste. Pouches are are a simple bag and are odor free. Different companies have disposable or reusable pouches to fit one's lifestyle. An ostomy can either be temporary or permanent.  THERE ARE THREE MAIN TYPES OF OSTOMIES  Colostomy. A colostomy is a surgically created opening in the large intestine (colon).  Ileostomy. An ileostomy is a  surgically created opening in the small intestine.  Urostomy. A urostomy is a surgically created opening to divert urine away from the bladder. FREQUENTLY ASKED QUESTIONS   Why haven't you met any of these folks who have an ostomy?  Well, maybe you have! You just did not recognize them because an ostomy doesn't show. It can be kept secret if you wish. Why, maybe some of your best friends, office associates or neighbors have an ostomy ... you never can tell.   People facing ostomy surgery have many quality-of-life questions like:  Will you bulge? Smell? Make noises? Will you feel waste leaving your body? Will you be a captive of the toilet? Will you starve? Be a social outcast? Get/stay married? Have babies? Easily bathe, go swimming, bend over?  OK, let's look at what you can expect:  Will you bulge?  Remember, without part of the intestine or bladder, and its contents, you should have a flatter tummy than before. You can expect to wear, with little exception, what you wore before surgery ... and this in-cludes tight clothing and bathing suits.  Will you smell?  Today, thanks to modern odor proof pouching systems, you can walk into an ostomy support group meeting and not smell anything that is foul or offensive. And, for those with an ileostomy or colostomy who are concerned about odor when emptying their pouch, there are in-pouch deodorants that can be used to eliminate any waste odors that may exist.  Will you make noises?  Everyone produces gas, especially if they are an air-swallower. But intestinal sounds that occur from time to time are no differ-ent than a gurgling tummy, and quite often your clothing will muffle any sounds.   Will you feel the waste discharges?  For those with a colostomy or ileostomy there might be a slight pressure when waste leaves your body, but understand that the intestines have no nerve endings, so there will be no unpleasant sensations. Those with a urostomy will  probably be unaware of any kidney drainage.  Will you be a captive of the toilet?  Immediately post-op you will spend more time in the bathroom than you will after your body recovers from surgery. Every person is different, but on average those with an ileostomy or urostomy may empty their pouches 4 to 6 times a day; a little  less if you have a colostomy. The average wear time between pouch system changes is 3 to 5 days and the changing process should take less than 30 minutes.  Will I need to be on a special diet? Most people return to their normal diet when they have recovered from surgery. Be sure to chew your food well, eat a well-balanced diet and drink plenty of fluids. If you experience problems with a certain food, wait a couple of weeks and try it again. Will there be odor and noises? Pouching systems are designed to be odor-proof or odor-resistant. There are deodorants that can be used in the pouch. Medications are also available to help reduce odor. Limit gas-producing foods and carbonated beverages. You will experience less gas and fewer noises as you heal from surgery. How much time will it take to care for my ostomy? At first, you may spend a lot of time learning about your ostomy and how to take care of it. As you become more comfortable and skilled at changing the pouching system, it will take very little time to care for it.  Will I be able to return  to work? People with ostomies can perform most jobs. As soon as you have healed from surgery, you should be able to return to work. Heavy lifting (more than 10 pounds) may be discouraged.  What about intimacy? Sexual relationships and intimacy are important and fulfilling aspects of your life. They should continue after ostomy surgery. Intimacy-related concerns should be discussed openly between you and your partner.  Can I wear regular clothing? You do not need to wear special clothing. Ostomy pouches are fairly flat and barely  noticeable. Elastic undergarments will not hurt the stoma or prevent the ostomy from functioning.  Can I participate in sports? An ostomy should not limit your involvement in sports. Many people with ostomies are runners, skiers, swimmers or participate in other active lifestyles. Talk with your caregiver first before doing heavy physical activity.  Will you starve?  Not if you follow doctor's orders at each stage of your post-op adjustment. There is no such thing as an "ostomy diet". Some people with an ostomy will be able to eat and tolerate anything; others may find diffi-culty with some foods. Each person is an individual and must determine, by trial, what is best for them. A good practice for all is to drink plenty of water.  Will you be a social outcast?  Have you met anyone who has an ostomy and is a social outcast? Why should you be the first? Only your attitude and self image will effect how you are treated. No confi-dent person is an Occupational psychologist.   PROFESSIONAL HELP  Resources are available if you need help or have questions about your ostomy.    Specially trained nurses called Wound, Ostomy Continence Nurses (WOCN) are available for consultation in most major medical centers.   Consider getting an ostomy consult with Cena Benton at [facility] to help troubleshoot stoma pouch fittings and other issues with your ostomy: [phone]   The United Ostomy Association (UOA) is a group made up of many local chapters throughout the [place]. These local groups hold meetings and provide support to prospective and existing ostomates. They sponsor educational events and have qualified visitors to make personal or telephone visits. Contact the UOA for the chapter nearest you and for other educational publications.  More detailed information can be found in Colostomy Guide, a publication of the [employer] (UOA). Contact UOA at 1-650-061-3113 or visit their web site at  https://arellano.com/. The website contains links to other sites, suppliers and resources. Document Released: 11/17/2003 Document Revised: 02/06/2012 Document Reviewed: 03/18/2009 [facility] Patient Information 2013 [place].

## 2014-04-23 NOTE — Progress Notes (Signed)
  Radiation Oncology         [phone] ________________________________  Name: Darren Hill MRN: [id number]  Date: 04/23/2014  DOB: Sep 18, 1966  SIMULATION AND TREATMENT PLANNING NOTE  The patient presented for simulation for the patient's upcoming course of preoperative radiation for the diagnosis of rectal cancer. The patient was placed in a supine position. A customized alpha cradle was constructed toaid in patient immobilization. This complex treatment device will be used on a daily basis during the treatment. In this fashion a CT scan was obtained through the pelvic region and the isocenter was placed near midline within the pelvis.  The patient will initially be planned to receive a course of radiation to a dose of 45 gray. This will be accomplished in 25 fractions at 1.8 gray per fraction. This initial treatment will correspond to a 3-D conformal technique. The gross tumor volume has been contoured in addition to the rectum, bladder and femoral heads. DVH's of each of these structures have been requested and these will be carefully reviewed as part of the 3-D conformal treatment planning process. To accomplish this initial treatment, 4 customized blocks have been designed for this purpose. Each of these 4 complex treatment devices will be used on a daily basis during the initial course of his treatment. It is anticipated that the patient will then receive a boost for an additional 5.4 gray. The anticipated total dose therefore will be 50.4 gray.  Special treatment procedure The patient will receive chemotherapy during the course of radiation treatment. The patient may experience increased or overlapping toxicity due to this combined-modality approach and the patient will be monitored for such problems. This may include extra lab work as necessary. This therefore constitutes a special treatment procedure.    ________________________________  Jodelle Gross, MD, PhD

## 2014-04-23 NOTE — Progress Notes (Signed)
  Radiation Oncology         [phone] ________________________________  Name: Darren Hill MRN: [id number]  Date: 04/23/2014  DOB: 07-28-1966  Optical Surface Tracking Plan:  Since intensity modulated radiotherapy (IMRT) and 3D conformal radiation treatment methods are predicated on accurate and precise positioning for treatment, intrafraction motion monitoring is medically necessary to ensure accurate and safe treatment delivery.  The ability to quantify intrafraction motion without excessive ionizing radiation dose can only be performed with optical surface tracking. Accordingly, surface imaging offers the opportunity to obtain 3D measurements of patient position throughout IMRT and 3D treatments without excessive radiation exposure.  I am ordering optical surface tracking for this patient's upcoming course of radiotherapy. ________________________________  Marye Round, MD 04/23/2014 4:01 PM    Reference:   Particia Jasper, et al. Surface imaging-based analysis of intrafraction motion for breast radiotherapy patients.Journal of [place], n. 6, nov. 2014. ISSN [id number].   Available at: <http://www.jacmp.org/index.php/jacmp/article/view/4957>.

## 2014-04-23 NOTE — Progress Notes (Signed)
Subjective:     Patient ID: Darren Hill, male   DOB: [birth year], 48 y.o.   MRN: [id number]  HPI  Note: This dictation was prepared with Dragon/digital dictation along with [facility] technology. Any transcriptional errors that result from this process are unintentional.       Darren Hill  [birth year] [id number]  Patient Care Team: Pcp Not In System as PCP - General Ladell Pier, MD as Consulting Physician (Oncology) Carola Frost, RN as Registered Nurse Marye Round, MD as Consulting Physician (Radiation Oncology) Missy Sabins, MD as Consulting Physician (Gastroenterology) [name], MD as Consulting Physician (General Surgery)  This patient is a 48 y.o.male who presents today for surgical evaluation at the request of Dr. Benay Spice.   Reason for visit: Rectal cancer  Pleasant truck driver.  Has had persistent rectal bleeding.    He was taken to a colonoscopy by Dr. Teena Irani on 03/31/2014. A sessile nonobstructing mass was found in the proximal rectum from 12-15 cm from the anal verge. Oozing was present. A biopsy was obtained. The area was tattooed. The exam was otherwise normal. The pathology ([id number]) revealed adenocarcinoma.  Dr. Paulita Fujita performed an endoscopic ultrasound 04/03/2012. The mass was measured at 10-15 cm from the anal verge. The mass was largely confined to the rectal wall, but an approximate 15 mm segment corresponded to the area of central ulceration where tumor was noted to penetrate through the muscularis propria. No peritumoral adenopathy. The tumor was staged as an ultrasound T3 N0 lesion.  He underwent staging CTs of the chest, abdomen, and pelvis on 04/04/2014. No suspicious pulmonary nodule. The liver appeared normal. An intraluminal mass was noted in the superior rectum measuring approximately 2.1 x 2.9 cm. No evidence of perirectal or other pelvic lymphadenopathy. No abdominal lymphadenopathy.  Surgical consultation was requested.  He comes  today with his wife.  Did not want a rectal exam today.  Afraid of any ostomy.  Many questions and concerns.  Really physically active.  He can walk 2 miles without difficulty.  No history of skin infections her MRSA.  No prior abdominal surgeries.  Had episode of chest pain 4 years ago with negative cardiac workup.  Felt most likely to be related to heartburn..  BM 2x/day.    No family history of GI/colon cancer, inflammatory bowel disease, irritable bowel syndrome, allergy such as Celiac Sprue, dietary/dairy problems, colitis, ulcers nor gastritis.  No recent sick contacts/gastroenteritis.  No travel outside the country.  No changes in diet.  No dysphagia to solids or liquids.  No significant heartburn or reflux.  No hematochezia, hematemesis, coffee ground emesis.  No evidence of prior gastric/peptic ulceration.    Patient Active Problem List   Diagnosis Date Noted  . Rectal cancer 04/10/2014  . PALPITATIONS 06/28/2010  . CHEST PAIN 06/28/2010  . ABNORMAL ELECTROCARDIOGRAM 06/28/2010    Past Medical History  Diagnosis Date  . Hypertension   . Colon cancer 04/03/14    rectal mass, colon ca    Past Surgical History  Procedure Laterality Date  . Eus N/A 04/03/2014    Procedure: LOWER ENDOSCOPIC ULTRASOUND (EUS);  Surgeon: Arta Silence, MD;  Location: [facility] ENDOSCOPY;  Service: Endoscopy;  Laterality: N/A;  H/P in file cabinet ja  . Colon biopsy N/A 04/03/14    rectal mass=adenocarcinoma    History   Social History  . Marital Status: Married    Spouse Name: N/A    Number of Children: 2  .  Years of Education: N/A   Occupational History  . TRUCK DRIVER    Social History Main Topics  . Smoking status: Former Smoker -- 0.25 packs/day for 15 years    Types: Cigarettes    Quit date: 04/03/2006  . Smokeless tobacco: Never Used  . Alcohol Use: No  . Drug Use: No  . Sexual Activity: Yes   Other Topics Concern  . Not on file   Social History Narrative  . No narrative on file     Family History  Problem Relation Age of Onset  . Brain cancer Maternal Aunt     not sure if tumor did operation on brain    Current Outpatient Prescriptions  Medication Sig Dispense Refill  . lisinopril (PRINIVIL,ZESTRIL) 10 MG tablet Take 10 mg by mouth daily.      . capecitabine (XELODA) 500 MG tablet Take 4 tablets (= 2000 mg) in the AM; Take 3 tablets (=1500 mg) in the PM.  Total daily dose of 3500 mg.  Take on days of Radiation Only.  140 tablet  0   No current facility-administered medications for this visit.     No Known Allergies  There were no vitals taken for this visit.  Ct Chest W Contrast  04/04/2014   CLINICAL DATA:  Newly diagnosed rectal carcinoma.  EXAM: CT CHEST, ABDOMEN, AND PELVIS WITH CONTRAST  TECHNIQUE: Multidetector CT imaging of the chest, abdomen and pelvis was performed following the standard protocol during bolus administration of intravenous contrast.  CONTRAST:  137mL OMNIPAQUE IOHEXOL 300 MG/ML  SOLN  COMPARISON:  None.  FINDINGS: CT CHEST FINDINGS  No evidence of mediastinal or hilar masses. No lymphadenopathy seen elsewhere within the thorax. No evidence of pleural or pericardial effusion.  No suspicious pulmonary nodules or masses are identified. Mild scarring noted in the inferior aspect of the lingula. No evidence of pulmonary airspace disease or central endobronchial lesion. No evidence of chest wall mass or suspicious bone lesions.  CT ABDOMEN AND PELVIS FINDINGS  The liver, gallbladder, pancreas, spleen, adrenal glands, and kidneys are normal in appearance. No evidence of hydronephrosis.  An intraluminal mass is seen in the superior rectum measuring approximately 2.1 x 2.9 cm on image 103. No evidence of perirectal or other pelvic lymphadenopathy. No evidence of abdominal lymphadenopathy. No other soft tissue masses identified. No evidence of bowel obstruction.  No evidence of inflammatory process or abnormal fluid collections. No suspicious bone lesions  identified.  IMPRESSION: 2.9 cm intraluminal mass in the superior rectum, consistent with known rectal carcinoma.  No evidence of local lymphadenopathy or distant metastatic disease.   Electronically Signed   By: Earle Gell M.D.   On: 04/04/2014 18:13   Ct Abdomen Pelvis W Contrast  04/04/2014   CLINICAL DATA:  Newly diagnosed rectal carcinoma.  EXAM: CT CHEST, ABDOMEN, AND PELVIS WITH CONTRAST  TECHNIQUE: Multidetector CT imaging of the chest, abdomen and pelvis was performed following the standard protocol during bolus administration of intravenous contrast.  CONTRAST:  189mL OMNIPAQUE IOHEXOL 300 MG/ML  SOLN  COMPARISON:  None.  FINDINGS: CT CHEST FINDINGS  No evidence of mediastinal or hilar masses. No lymphadenopathy seen elsewhere within the thorax. No evidence of pleural or pericardial effusion.  No suspicious pulmonary nodules or masses are identified. Mild scarring noted in the inferior aspect of the lingula. No evidence of pulmonary airspace disease or central endobronchial lesion. No evidence of chest wall mass or suspicious bone lesions.  CT ABDOMEN AND PELVIS FINDINGS  The  liver, gallbladder, pancreas, spleen, adrenal glands, and kidneys are normal in appearance. No evidence of hydronephrosis.  An intraluminal mass is seen in the superior rectum measuring approximately 2.1 x 2.9 cm on image 103. No evidence of perirectal or other pelvic lymphadenopathy. No evidence of abdominal lymphadenopathy. No other soft tissue masses identified. No evidence of bowel obstruction.  No evidence of inflammatory process or abnormal fluid collections. No suspicious bone lesions identified.  IMPRESSION: 2.9 cm intraluminal mass in the superior rectum, consistent with known rectal carcinoma.  No evidence of local lymphadenopathy or distant metastatic disease.   Electronically Signed   By: Earle Gell M.D.   On: 04/04/2014 18:13     Review of Systems  Constitutional: Negative for fever, chills and diaphoresis.   HENT: Negative for ear discharge, facial swelling, mouth sores, nosebleeds, sore throat and trouble swallowing.   Eyes: Negative for photophobia, discharge and visual disturbance.  Respiratory: Negative for choking, chest tightness, shortness of breath and stridor.   Cardiovascular: Negative for chest pain and palpitations.  Gastrointestinal: Positive for anal bleeding. Negative for nausea, vomiting, abdominal pain, diarrhea, constipation, blood in stool, abdominal distention and rectal pain.  Endocrine: Negative for cold intolerance and heat intolerance.  Genitourinary: Negative for dysuria, urgency, difficulty urinating and testicular pain.  Musculoskeletal: Negative for arthralgias, back pain, gait problem and myalgias.  Skin: Negative for color change, pallor, rash and wound.  Allergic/Immunologic: Negative for environmental allergies and food allergies.  Neurological: Negative for dizziness, speech difficulty, weakness, numbness and headaches.  Hematological: Negative for adenopathy. Does not bruise/bleed easily.  Psychiatric/Behavioral: Negative for hallucinations, confusion and agitation.       Objective:   Physical Exam  Constitutional: He is oriented to person, place, and time. He appears well-developed and well-nourished. No distress.  HENT:  Head: Normocephalic.  Mouth/Throat: Oropharynx is clear and moist. No oropharyngeal exudate.  Eyes: Conjunctivae and EOM are normal. Pupils are equal, round, and reactive to light. No scleral icterus.  Neck: Normal range of motion. Neck supple. No tracheal deviation present.  Cardiovascular: Normal rate, regular rhythm and intact distal pulses.   Pulmonary/Chest: Effort normal and breath sounds normal. No respiratory distress.  Abdominal: Soft. He exhibits no distension. There is no tenderness. Hernia confirmed negative in the right inguinal area and confirmed negative in the left inguinal area.  Genitourinary: Prostate normal, testes  normal and penis normal. Rectal exam shows no fissure.  Perianal skin clean with good hygiene.  No pruritis.  No pilonidal disease.  No fissure.  No abscess/fistula.  No external hemorrhoids.  Normal sphincter tone.  Tolerates digital rectal exam.   Hemorrhoidal piles WNL.  Fixed anterior rectal wall mass ~9cm from anal verge   Musculoskeletal: Normal range of motion. He exhibits no tenderness.  Lymphadenopathy:    He has no cervical adenopathy.       Right: No inguinal adenopathy present.       Left: No inguinal adenopathy present.  Neurological: He is alert and oriented to person, place, and time. No cranial nerve deficit. He exhibits normal muscle tone. Coordination normal.  Skin: Skin is warm and dry. No rash noted. He is not diaphoretic. No erythema. No pallor.  Psychiatric: He has a normal mood and affect. His behavior is normal. Judgment and thought content normal.       Assessment:     Anterior rectal cancer     Plan:     I think he would benefit from surgery at some point.  Certainly  sphincter sparing approach is reasonable.  He may need a diverting loop ileostomy if the anastomosis is distal to 5 cm, but because he is not a morbidly obese smoker hopefully we can avoid that.  Would have to make decision intraoperatively.  He is very anxious and avoiding that.  I spent some time discussing with the patient and his wife in detail.  Questions were answered.  One over the multidisciplinary approach.  Probably will do rigid proctoscopy with tattooing of the cancer & Foley tube rectal decompression intraoperatively:  The anatomy & physiology of the digestive tract was discussed.  The pathophysiology was discussed.  Natural history risks without surgery was discussed.   I worked to give an overview of the disease and the frequent need to have multispecialty involvement.  I feel the risks of no intervention will lead to serious problems that outweigh the operative risks; therefore, I  recommended a partial proctocolectomy to remove the pathology.  Minimally Invasive (Robotic/Laparoscopic) & open techniques were discussed.  We will work to preserve anal & pelvic floor function without sacrificing cure.  Risks such as bleeding, infection, abscess, leak, reoperation, possible ostomy, hernia, heart attack, death, and other risks were discussed.  I noted a good likelihood this will help address the problem.   Goals of post-operative recovery were discussed as well.  We will work to minimize complications.  An educational handout on the pathology was given as well.  Questions were answered.    The patient expresses understanding & wishes to proceed with surgery.  We did give instructions on bowel prep and antibiotics.  He is about to start neoadjuvant chemoradiation therapy.  I will have him meet [place] after that is completed to go over things again and then discuss surgery.  Colonoscopy for any siblings older than 71.

## 2014-04-29 ENCOUNTER — Telehealth: Payer: Self-pay | Admitting: *Deleted

## 2014-04-29 NOTE — Telephone Encounter (Signed)
Spoke with patient by phone and reviewed his schedule.  He has received his Xeloda.  He had no further questions.

## 2014-04-30 ENCOUNTER — Encounter (INDEPENDENT_AMBULATORY_CARE_PROVIDER_SITE_OTHER): Payer: Self-pay

## 2014-04-30 ENCOUNTER — Ambulatory Visit: Payer: Commercial Managed Care - PPO | Admitting: Radiation Oncology

## 2014-05-01 ENCOUNTER — Ambulatory Visit: Payer: Commercial Managed Care - PPO

## 2014-05-02 ENCOUNTER — Ambulatory Visit: Payer: Commercial Managed Care - PPO

## 2014-05-05 ENCOUNTER — Ambulatory Visit (HOSPITAL_BASED_OUTPATIENT_CLINIC_OR_DEPARTMENT_OTHER): Payer: Commercial Managed Care - PPO | Admitting: Oncology

## 2014-05-05 ENCOUNTER — Telehealth: Payer: Self-pay | Admitting: Oncology

## 2014-05-05 ENCOUNTER — Ambulatory Visit
Admission: RE | Admit: 2014-05-05 | Discharge: 2014-05-05 | Disposition: A | Discharge status: Home / Self Care | Payer: Commercial Managed Care - PPO | Source: Ambulatory Visit | Attending: Radiation Oncology | Admitting: Radiation Oncology

## 2014-05-05 VITALS — BP 122/79 | HR 84 | Temp 98.0°F | Resp 19 | Ht 69.0 in | Wt 182.6 lb

## 2014-05-05 DIAGNOSIS — C20 Malignant neoplasm of rectum: Secondary | ICD-10-CM

## 2014-05-05 NOTE — Progress Notes (Signed)
  [place] OFFICE PROGRESS NOTE   Diagnosis: Rectal cancer  INTERVAL HISTORY:   Mr. Darren Hill returns as scheduled. He starts radiation today. He began Xeloda today. He denies rectal bleeding. No other complaint.  Mr. Herda believes the cancer has resolved. He has been praying and would like confirmation there is tumor remaining.  Objective:  Vital signs in last 24 hours:  Blood pressure 122/79, pulse 84, temperature 98 F (36.7 C), temperature source Oral, resp. rate 19, height 5\' 9"  (1.753 m), weight 182 lb 9.6 oz (82.827 kg).   Physical examination-not performed today  Lab Results:  Lab Results  Component Value Date   WBC 6.3 04/15/2014   HGB 13.2 04/15/2014   HCT 40.2 04/15/2014   MCV 87.5 04/15/2014   PLT 255 04/15/2014   NEUTROABS 2.7 04/15/2014    Lab Results  Component Value Date   NA 139 04/15/2014    Lab Results  Component Value Date   CEA 1.2 04/15/2014    Imaging:  I reviewed the CT images from 04/04/2014 with Mr. Yono and his wife.  Medications: I have reviewed the patient's current medications.  Assessment/Plan: 1. Clinical stage II Rectal cancer, posterior rectal mass identified at 10-15 cm from the anal verge, a biopsy 03/31/2014 confirmed adenocarcinoma Endoscopic ultrasound 04/03/2014 revealed a (uT3,uN0) lesion 2. Hypertension   Disposition:  He appears well. He is scheduled to begin capecitabine and radiation today. He saw Dr. Johney Maine and a rectal mass was palpated approximately 9 cm from the anal verge. Mr. Empson is convinced the tumor has resolved. He requests a "CT scan" to confirm the presence of remaining tumor. I explained the high likelihood of remaining tumor based on repeat examinations confirming a rectal mass and the lack of treatment to date.  I contacted Dr. Paulita Fujita. He agrees to a repeat sigmoidoscopy to assess for tumor.  Mr. Aprea agrees to begin treatment today. He will return for an office visit in 2 weeks.  Ladell Pier, MD  05/05/2014  4:06 PM

## 2014-05-05 NOTE — Progress Notes (Signed)
  Radiation Oncology         [phone] ________________________________  Name: Darren Hill MRN: [id number]  Date: 05/05/2014  DOB: 1966-06-21  Simulation Verification Note  Status: outpatient  NARRATIVE: The patient was brought to the treatment unit and placed in the planned treatment position. The clinical setup was verified. Then port films were obtained and uploaded to the radiation oncology medical record software.  The treatment beams were carefully compared against the planned radiation fields. The position location and shape of the radiation fields was reviewed. They targeted volume of tissue appears to be appropriately covered by the radiation beams. Organs at risk appear to be excluded as planned.  Based on my personal review, I approved the simulation verification. The patient's treatment will proceed as planned.  ------------------------------------------------  Sheral Apley Tammi Klippel, M.D.

## 2014-05-05 NOTE — Telephone Encounter (Signed)
lmonvm for pt re appt for 6/22 and mailed schedule.

## 2014-05-06 ENCOUNTER — Ambulatory Visit
Admission: RE | Admit: 2014-05-06 | Discharge: 2014-05-06 | Disposition: A | Discharge status: Home / Self Care | Payer: Commercial Managed Care - PPO | Source: Ambulatory Visit | Attending: Radiation Oncology | Admitting: Radiation Oncology

## 2014-05-07 ENCOUNTER — Telehealth: Payer: Self-pay | Admitting: *Deleted

## 2014-05-07 ENCOUNTER — Ambulatory Visit
Admission: RE | Admit: 2014-05-07 | Discharge: 2014-05-07 | Disposition: A | Discharge status: Home / Self Care | Payer: Commercial Managed Care - PPO | Source: Ambulatory Visit | Attending: Radiation Oncology | Admitting: Radiation Oncology

## 2014-05-07 ENCOUNTER — Encounter: Payer: Self-pay | Admitting: *Deleted

## 2014-05-07 NOTE — Progress Notes (Signed)
Patient education done, rad book, my business card,  Given yesterday for patient to read folded pages , today sit bath provided, discussed side schedule treatment s printed calender,pataien gave effects ,n,v,d, fatigue,skin irritation, pain, bladder changes,,increase protein in diet, stay hydrated, may need to eat 5-6 smaller meals, have imodium prn for diarrhea,  patient  gave verbal understanding,teach back 4:14 PM

## 2014-05-07 NOTE — Telephone Encounter (Signed)
Spoke with patient by phone.  He started his Xeloda and Radiation on 05/06/14.  He denied having any questions related to treatment.  He said that Dr. Erlinda Hong office had contacted him and that he is having a sigmoidoscopy on 05/12/14.  Patient expressed appreciation for the call and denied questions or concerns.  Dr. Benay Spice informed of above.

## 2014-05-08 ENCOUNTER — Ambulatory Visit
Admission: RE | Admit: 2014-05-08 | Discharge: 2014-05-08 | Disposition: A | Discharge status: Home / Self Care | Payer: Commercial Managed Care - PPO | Source: Ambulatory Visit | Attending: Radiation Oncology | Admitting: Radiation Oncology

## 2014-05-09 ENCOUNTER — Ambulatory Visit
Admission: RE | Admit: 2014-05-09 | Discharge: 2014-05-09 | Disposition: A | Discharge status: Home / Self Care | Payer: Commercial Managed Care - PPO | Source: Ambulatory Visit | Attending: Radiation Oncology | Admitting: Radiation Oncology

## 2014-05-09 ENCOUNTER — Encounter: Payer: Self-pay | Admitting: Radiation Oncology

## 2014-05-09 VITALS — BP 112/69 | HR 85 | Temp 97.8°F | Resp 20 | Wt 180.6 lb

## 2014-05-09 DIAGNOSIS — C20 Malignant neoplasm of rectum: Secondary | ICD-10-CM

## 2014-05-09 NOTE — Progress Notes (Signed)
   Department of Radiation Oncology  Phone:  [phone] Fax:        [phone]  Weekly Treatment Note    Name: Darren Hill Date: 05/09/2014 MRN: [id number] DOB: 1966-07-01   Current dose: 7.2 Gy  Current fraction: 4   MEDICATIONS: Current Outpatient Prescriptions  Medication Sig Dispense Refill  . capecitabine (XELODA) 500 MG tablet Take 4 tablets (= 2000 mg) in the AM; Take 3 tablets (=1500 mg) in the PM.  Total daily dose of 3500 mg.  Take on days of Radiation Only.  140 tablet  0  . lisinopril (PRINIVIL,ZESTRIL) 10 MG tablet Take 10 mg by mouth daily.      . metroNIDAZOLE (FLAGYL) 500 MG tablet Take 1 tablet (500 mg total) by mouth as directed. Take 2 pills (=1000mg ) by mouth at 1pm, 3pm, and 10pm the day before your colorectal operation as discussed in [place] office.  Call [phone] with questions  6 tablet  0  . neomycin (MYCIFRADIN) 500 MG tablet Take 2 tablets (1,000 mg total) by mouth as directed. Take 2 pills (=1000mg ) by mouth at 1pm, 3pm, and 10pm the day before your colorectal operation as discussed in [place] office.  Call [phone] with questions  6 tablet  0   No current facility-administered medications for this encounter.     ALLERGIES: Review of patient's allergies indicates no known allergies.   LABORATORY DATA:  Lab Results  Component Value Date   WBC 6.3 04/15/2014   HGB 13.2 04/15/2014   HCT 40.2 04/15/2014   MCV 87.5 04/15/2014   PLT 255 04/15/2014   Lab Results  Component Value Date   NA 139 04/15/2014   K 3.9 04/15/2014   CL 106 03/05/2011   CO2 25 04/15/2014   Lab Results  Component Value Date   ALT 9 04/15/2014   AST 17 04/15/2014   ALKPHOS 48 04/15/2014   BILITOT 0.34 04/15/2014     NARRATIVE: [name] was seen today for weekly treatment management. The chart was checked and the patient's films were reviewed. The patient states he is doing very well with his first week of treatment. No fatigue. Good appetite. No complaints of pain  for nausea.  PHYSICAL EXAMINATION: weight is 180 lb 9.6 oz (81.92 kg). His oral temperature is 97.8 F (36.6 C). His blood pressure is 112/69 and his pulse is 85. His respiration is 20.        ASSESSMENT: The patient is doing satisfactorily with treatment.  PLAN: We will continue with the patient's radiation treatment as planned.

## 2014-05-09 NOTE — Progress Notes (Signed)
Weekly rad txs rectal 4th tx, drinks > 8 glasses water daily, stools normal no c/o pain, or nausea, education done 05/07/14, good appetite and no fatigue 10:00 AM

## 2014-05-09 NOTE — Addendum Note (Signed)
Encounter addended by: Rebecca Eaton, RN on: 05/09/2014  8:12 AM<BR>     Documentation filed: Charges VN

## 2014-05-12 ENCOUNTER — Ambulatory Visit
Admission: RE | Admit: 2014-05-12 | Discharge: 2014-05-12 | Disposition: A | Discharge status: Home / Self Care | Payer: Commercial Managed Care - PPO | Source: Ambulatory Visit | Attending: Radiation Oncology | Admitting: Radiation Oncology

## 2014-05-13 ENCOUNTER — Ambulatory Visit
Admission: RE | Admit: 2014-05-13 | Discharge: 2014-05-13 | Disposition: A | Discharge status: Home / Self Care | Payer: Commercial Managed Care - PPO | Source: Ambulatory Visit | Attending: Radiation Oncology | Admitting: Radiation Oncology

## 2014-05-14 ENCOUNTER — Ambulatory Visit
Admission: RE | Admit: 2014-05-14 | Discharge: 2014-05-14 | Disposition: A | Discharge status: Home / Self Care | Payer: Commercial Managed Care - PPO | Source: Ambulatory Visit | Attending: Radiation Oncology | Admitting: Radiation Oncology

## 2014-05-14 ENCOUNTER — Encounter: Payer: Self-pay | Admitting: Radiation Oncology

## 2014-05-14 NOTE — Progress Notes (Signed)
  Radiation Oncology         [phone] ________________________________  Name: Darren Hill MRN: [id number]  Date: 05/15/2014  DOB: [birth year]  Weekly Radiation Therapy Management  Current Dose: 14.4 Gy     Planned Dose:  45 Gy  Narrative . . . . . . . . The patient presents for routine under treatment assessment.                                   The patient is without complaint. Weekly assessment of radiation to rectum.Completed 8 of 25 treatments.Denies pain, nausea or vomiting.No fatigue.Scheduled for med/onc appointment next week                                 Set-up films were reviewed.                                 The chart was checked. Physical Findings. . .  weight is 181 lb 1.6 oz (82.146 kg). His temperature is 97.8 F (36.6 C). His blood pressure is 127/71 and his pulse is 83. . Weight essentially stable.  No significant changes. Impression . . . . . . . The patient is tolerating radiation. Plan . . . . . . . . . . . . Continue treatment as planned.  ________________________________  Sheral Apley. Tammi Klippel, M.D.

## 2014-05-15 ENCOUNTER — Ambulatory Visit
Admission: RE | Admit: 2014-05-15 | Discharge: 2014-05-15 | Disposition: A | Discharge status: Home / Self Care | Payer: Commercial Managed Care - PPO | Source: Ambulatory Visit | Attending: Radiation Oncology | Admitting: Radiation Oncology

## 2014-05-15 VITALS — BP 127/71 | HR 83 | Temp 97.8°F | Wt 181.1 lb

## 2014-05-15 DIAGNOSIS — C20 Malignant neoplasm of rectum: Secondary | ICD-10-CM

## 2014-05-15 NOTE — Progress Notes (Signed)
Weekly assessment of radiation to rectum.Completed 8 of 25 treatments.Denies pain, nausea or vomiting.No fatigue.Scheduled for med/onc appointment next week.

## 2014-05-16 ENCOUNTER — Ambulatory Visit
Admission: RE | Admit: 2014-05-16 | Discharge: 2014-05-16 | Disposition: A | Discharge status: Home / Self Care | Payer: Commercial Managed Care - PPO | Source: Ambulatory Visit | Attending: Radiation Oncology | Admitting: Radiation Oncology

## 2014-05-19 ENCOUNTER — Ambulatory Visit
Admission: RE | Admit: 2014-05-19 | Discharge: 2014-05-19 | Disposition: A | Discharge status: Home / Self Care | Payer: Commercial Managed Care - PPO | Source: Ambulatory Visit | Attending: Radiation Oncology | Admitting: Radiation Oncology

## 2014-05-19 ENCOUNTER — Ambulatory Visit (HOSPITAL_BASED_OUTPATIENT_CLINIC_OR_DEPARTMENT_OTHER): Payer: Commercial Managed Care - PPO | Admitting: Nurse Practitioner

## 2014-05-19 ENCOUNTER — Telehealth: Payer: Self-pay | Admitting: Oncology

## 2014-05-19 ENCOUNTER — Other Ambulatory Visit (HOSPITAL_BASED_OUTPATIENT_CLINIC_OR_DEPARTMENT_OTHER): Payer: Commercial Managed Care - PPO

## 2014-05-19 VITALS — BP 129/82 | HR 83 | Temp 97.2°F | Resp 18 | Ht 69.0 in | Wt 181.5 lb

## 2014-05-19 DIAGNOSIS — C20 Malignant neoplasm of rectum: Secondary | ICD-10-CM

## 2014-05-19 LAB — CBC WITH DIFFERENTIAL/PLATELET
BASO%: 0.6 % (ref 0.0–2.0)
Basophils Absolute: 0 10*3/uL (ref 0.0–0.1)
EOS%: 5.5 % (ref 0.0–7.0)
Eosinophils Absolute: 0.2 10*3/uL (ref 0.0–0.5)
HEMATOCRIT: 39.6 % (ref 38.4–49.9)
HGB: 13.1 g/dL (ref 13.0–17.1)
LYMPH%: 35.2 % (ref 14.0–49.0)
MCH: 28.9 pg (ref 27.2–33.4)
MCHC: 33.1 g/dL (ref 32.0–36.0)
MCV: 87.3 fL (ref 79.3–98.0)
MONO#: 0.3 10*3/uL (ref 0.1–0.9)
MONO%: 10.6 % (ref 0.0–14.0)
NEUT#: 1.5 10*3/uL (ref 1.5–6.5)
NEUT%: 48.1 % (ref 39.0–75.0)
PLATELETS: 231 10*3/uL (ref 140–400)
RBC: 4.53 10*6/uL (ref 4.20–5.82)
RDW: 12.9 % (ref 11.0–14.6)
WBC: 3.2 10*3/uL — AB (ref 4.0–10.3)
lymph#: 1.1 10*3/uL (ref 0.9–3.3)

## 2014-05-19 NOTE — Progress Notes (Signed)
  [place] OFFICE PROGRESS NOTE   Diagnosis:  Rectal cancer.  INTERVAL HISTORY:   Darren Hill returns as scheduled. He continues radiation and Xeloda. He denies nausea/vomiting. No mouth sores. No diarrhea. No hand or foot pain or redness. He notes less rectal bleeding. He denies rectal pain.  Objective:  Vital signs in last 24 hours:  Blood pressure 129/82, pulse 83, temperature 97.2 F (36.2 C), temperature source Oral, resp. rate 18, height 5\' 9"  (1.753 m), weight 181 lb 8 oz (82.328 kg).    HEENT: No thrush or ulcerations. Resp: Lungs clear. Cardio: Regular cardiac rhythm. GI: Abdomen soft and nontender. No organomegaly. No erythema or skin breakdown at the perineum. Vascular: No leg edema.  Skin: Palms without erythema.    Lab Results:  Lab Results  Component Value Date   WBC 3.2* 05/19/2014   HGB 13.1 05/19/2014   HCT 39.6 05/19/2014   MCV 87.3 05/19/2014   PLT 231 05/19/2014   NEUTROABS 1.5 05/19/2014    Imaging:  No results found.  Medications: I have reviewed the patient's current medications.  Assessment/Plan: 1. Clinical stage II Rectal cancer, posterior rectal mass identified at 10-15 cm from the anal verge, a biopsy 03/31/2014 confirmed adenocarcinoma Endoscopic ultrasound 04/03/2014 revealed a (uT3,uN0) lesion Initiation of radiation/Xeloda 05/05/2014. 2. Hypertension.   Disposition: Darren Hill appears stable. Plan to continue radiation/Xeloda. The white count is mildly decreased. We will begin weekly labs. We will see him in followup in the next 2-3 weeks. He will contact the office in the interim with any problems.  Plan reviewed with Dr. Benay Spice.    Ned Card ANP/GNP-BC   05/19/2014  4:35 PM

## 2014-05-19 NOTE — Telephone Encounter (Signed)
gv adn printed appt sched and avs for pt for July  °

## 2014-05-20 ENCOUNTER — Ambulatory Visit
Admission: RE | Admit: 2014-05-20 | Discharge: 2014-05-20 | Disposition: A | Discharge status: Home / Self Care | Payer: Commercial Managed Care - PPO | Source: Ambulatory Visit | Attending: Radiation Oncology | Admitting: Radiation Oncology

## 2014-05-21 ENCOUNTER — Ambulatory Visit: Payer: Commercial Managed Care - PPO

## 2014-05-22 ENCOUNTER — Ambulatory Visit
Admission: RE | Admit: 2014-05-22 | Discharge: 2014-05-22 | Disposition: A | Discharge status: Home / Self Care | Payer: Commercial Managed Care - PPO | Source: Ambulatory Visit | Attending: Radiation Oncology | Admitting: Radiation Oncology

## 2014-05-23 ENCOUNTER — Ambulatory Visit
Admission: RE | Admit: 2014-05-23 | Discharge: 2014-05-23 | Disposition: A | Discharge status: Home / Self Care | Payer: Commercial Managed Care - PPO | Source: Ambulatory Visit | Attending: Radiation Oncology | Admitting: Radiation Oncology

## 2014-05-23 ENCOUNTER — Encounter: Payer: Self-pay | Admitting: Radiation Oncology

## 2014-05-23 VITALS — BP 125/90 | HR 90 | Temp 97.4°F | Resp 20 | Wt 184.4 lb

## 2014-05-23 DIAGNOSIS — C20 Malignant neoplasm of rectum: Secondary | ICD-10-CM | POA: Diagnosis not present

## 2014-05-23 DIAGNOSIS — Z510 Encounter for antineoplastic radiation therapy: Secondary | ICD-10-CM | POA: Diagnosis present

## 2014-05-23 NOTE — Progress Notes (Signed)
   Department of Radiation Oncology  Phone:  [phone] Fax:        [phone]  Weekly Treatment Note    Name: Darren Hill Date: 05/23/2014 MRN: [id number] DOB: [birth year]   Current dose: 23.4 Gy  Current fraction: 13   MEDICATIONS: Current Outpatient Prescriptions  Medication Sig Dispense Refill  . capecitabine (XELODA) 500 MG tablet Take 4 tablets (= 2000 mg) in the AM; Take 3 tablets (=1500 mg) in the PM.  Total daily dose of 3500 mg.  Take on days of Radiation Only.  140 tablet  0  . lisinopril (PRINIVIL,ZESTRIL) 10 MG tablet Take 10 mg by mouth daily.      [name] neomycin (MYCIFRADIN) 500 MG tablet Take 2 tablets (1,000 mg total) by mouth as directed. Take 2 pills (=1000mg ) by mouth at 1pm, 3pm, and 10pm the day before your colorectal operation as discussed in [place] office.  Call [phone] with questions  6 tablet  0  . metroNIDAZOLE (FLAGYL) 500 MG tablet Take 1 tablet (500 mg total) by mouth as directed. Take 2 pills (=1000mg ) by mouth at 1pm, 3pm, and 10pm the day before your colorectal operation as discussed in [place] office.  Call [phone] with questions  6 tablet  0   No current facility-administered medications for this encounter.     ALLERGIES: Review of patient's allergies indicates no known allergies.   LABORATORY DATA:  Lab Results  Component Value Date   WBC 3.2* 05/19/2014   HGB 13.1 05/19/2014   HCT 39.6 05/19/2014   MCV 87.3 05/19/2014   PLT 231 05/19/2014   Lab Results  Component Value Date   NA 139 04/15/2014   K 3.9 04/15/2014   CL 106 03/05/2011   CO2 25 04/15/2014   Lab Results  Component Value Date   ALT 9 04/15/2014   AST 17 04/15/2014   ALKPHOS 48 04/15/2014   BILITOT 0.34 04/15/2014     NARRATIVE: Darren Hill was seen today for weekly treatment management. The chart was checked and the patient's films were reviewed. The patient is doing very well he states. Regular bowel movements and no significant complaints of pain. No fatigue and  his appetite has been good.  PHYSICAL EXAMINATION: weight is 184 lb 6.4 oz (83.643 kg). His oral temperature is 97.4 F (36.3 C). His blood pressure is 125/90 and his pulse is 90. His respiration is 20.        ASSESSMENT: The patient is doing satisfactorily with treatment.  PLAN: We will continue with the patient's radiation treatment as planned.

## 2014-05-23 NOTE — Progress Notes (Signed)
Weekly rad txs, 13 completed rectum, no c/o pain, nausea, regular bowel movements so far, no bleeding, or discomfort, no dysuria, no fatigue, appetite good 8:52 AM

## 2014-05-26 ENCOUNTER — Ambulatory Visit
Admission: RE | Admit: 2014-05-26 | Discharge: 2014-05-26 | Disposition: A | Discharge status: Home / Self Care | Payer: Commercial Managed Care - PPO | Source: Ambulatory Visit | Attending: Radiation Oncology | Admitting: Radiation Oncology

## 2014-05-26 DIAGNOSIS — Z510 Encounter for antineoplastic radiation therapy: Secondary | ICD-10-CM | POA: Diagnosis not present

## 2014-05-27 ENCOUNTER — Other Ambulatory Visit: Payer: Self-pay | Admitting: *Deleted

## 2014-05-27 ENCOUNTER — Ambulatory Visit
Admission: RE | Admit: 2014-05-27 | Discharge: 2014-05-27 | Disposition: A | Discharge status: Home / Self Care | Payer: Commercial Managed Care - PPO | Source: Ambulatory Visit | Attending: Radiation Oncology | Admitting: Radiation Oncology

## 2014-05-27 DIAGNOSIS — C20 Malignant neoplasm of rectum: Secondary | ICD-10-CM

## 2014-05-27 DIAGNOSIS — Z510 Encounter for antineoplastic radiation therapy: Secondary | ICD-10-CM | POA: Diagnosis not present

## 2014-05-27 MED ORDER — CAPECITABINE 500 MG PO TABS: ORAL_TABLET | ORAL | Status: DC

## 2014-05-27 NOTE — Telephone Encounter (Signed)
Patient called to report he will need to refill his Xeloda to finish out his radiation therapy. Will be #56 pills short. Last RT is 06/16/14

## 2014-05-28 ENCOUNTER — Ambulatory Visit
Admission: RE | Admit: 2014-05-28 | Discharge: 2014-05-28 | Disposition: A | Discharge status: Home / Self Care | Payer: Commercial Managed Care - PPO | Source: Ambulatory Visit | Attending: Radiation Oncology | Admitting: Radiation Oncology

## 2014-05-29 ENCOUNTER — Ambulatory Visit
Admission: RE | Admit: 2014-05-29 | Discharge: 2014-05-29 | Disposition: A | Discharge status: Home / Self Care | Payer: Commercial Managed Care - PPO | Source: Ambulatory Visit | Attending: Radiation Oncology | Admitting: Radiation Oncology

## 2014-05-29 ENCOUNTER — Encounter: Payer: Self-pay | Admitting: Radiation Oncology

## 2014-05-29 ENCOUNTER — Other Ambulatory Visit: Payer: Self-pay | Admitting: Oncology

## 2014-05-29 VITALS — BP 131/84 | HR 77 | Temp 97.4°F | Resp 20 | Wt 183.8 lb

## 2014-05-29 DIAGNOSIS — C20 Malignant neoplasm of rectum: Secondary | ICD-10-CM

## 2014-05-29 NOTE — Progress Notes (Signed)
Weekly rad txs rectum, 17 completed, a small skin tear in between cheeks of buttocks, using baby wipes, regular bm's, no dysuria or bleeding, sugessted to use neosporin in the skin tear after rad txs 8:53 AM

## 2014-05-29 NOTE — Progress Notes (Signed)
   Department of Radiation Oncology  Phone:  [phone] Fax:        [phone]  Weekly Treatment Note    Name: Darren Hill Date: 05/29/2014 MRN: [id number] DOB: Aug 16, 1966   Current dose: 30.6 Gy  Current fraction: 17   MEDICATIONS: Current Outpatient Prescriptions  Medication Sig Dispense Refill  . capecitabine (XELODA) 500 MG tablet Take 4 tablets (= 2000 mg) in the AM; Take 3 tablets (=1500 mg) in the PM.  Total daily dose of 3500 mg.  Take on days of Radiation Only.  56 tablet  0  . lisinopril (PRINIVIL,ZESTRIL) 10 MG tablet Take 10 mg by mouth daily.      . metroNIDAZOLE (FLAGYL) 500 MG tablet Take 1 tablet (500 mg total) by mouth as directed. Take 2 pills (=1000mg ) by mouth at 1pm, 3pm, and 10pm the day before your colorectal operation as discussed in [place] office.  Call [phone] with questions  6 tablet  0  . neomycin (MYCIFRADIN) 500 MG tablet Take 2 tablets (1,000 mg total) by mouth as directed. Take 2 pills (=1000mg ) by mouth at 1pm, 3pm, and 10pm the day before your colorectal operation as discussed in [place] office.  Call [phone] with questions  6 tablet  0   No current facility-administered medications for this encounter.     ALLERGIES: Review of patient's allergies indicates no known allergies.   LABORATORY DATA:  Lab Results  Component Value Date   WBC 3.2* 05/19/2014   HGB 13.1 05/19/2014   HCT 39.6 05/19/2014   MCV 87.3 05/19/2014   PLT 231 05/19/2014   Lab Results  Component Value Date   NA 139 04/15/2014   K 3.9 04/15/2014   CL 106 03/05/2011   CO2 25 04/15/2014   Lab Results  Component Value Date   ALT 9 04/15/2014   AST 17 04/15/2014   ALKPHOS 48 04/15/2014   BILITOT 0.34 04/15/2014     NARRATIVE: Darren Hill was seen today for weekly treatment management. The chart was checked and the patient's films were reviewed. The patient is doing well overall. He has one area of irritation superiorly in the buttock region and nursing has discussed  using Neosporin on this. Regular bowel movements.  PHYSICAL EXAMINATION: weight is 183 lb 12.8 oz (83.371 kg). His oral temperature is 97.4 F (36.3 C). His blood pressure is 131/84 and his pulse is 77. His respiration is 20.      skin overall is holding up well with above change  ASSESSMENT: The patient is doing satisfactorily with treatment.  PLAN: We will continue with the patient's radiation treatment as planned.

## 2014-06-02 ENCOUNTER — Ambulatory Visit
Admission: RE | Admit: 2014-06-02 | Discharge: 2014-06-02 | Disposition: A | Discharge status: Home / Self Care | Payer: Commercial Managed Care - PPO | Source: Ambulatory Visit | Attending: Radiation Oncology | Admitting: Radiation Oncology

## 2014-06-02 ENCOUNTER — Ambulatory Visit: Payer: Commercial Managed Care - PPO

## 2014-06-02 ENCOUNTER — Telehealth: Payer: Self-pay | Admitting: *Deleted

## 2014-06-02 NOTE — Telephone Encounter (Signed)
Call from pt reporting he will need #61 tablets to complete radiation. Called pharmacy to confirm quantity shipped. [facility] rep reports #140 initial shipment, #56 for refill. This should be sufficient quantity for #28 treatments.

## 2014-06-03 ENCOUNTER — Ambulatory Visit
Admission: RE | Admit: 2014-06-03 | Discharge: 2014-06-03 | Disposition: A | Discharge status: Home / Self Care | Payer: Commercial Managed Care - PPO | Source: Ambulatory Visit | Attending: Radiation Oncology | Admitting: Radiation Oncology

## 2014-06-03 ENCOUNTER — Ambulatory Visit: Payer: Commercial Managed Care - PPO

## 2014-06-04 ENCOUNTER — Ambulatory Visit
Admission: RE | Admit: 2014-06-04 | Discharge: 2014-06-04 | Disposition: A | Discharge status: Home / Self Care | Payer: Commercial Managed Care - PPO | Source: Ambulatory Visit | Attending: Radiation Oncology | Admitting: Radiation Oncology

## 2014-06-04 ENCOUNTER — Ambulatory Visit: Payer: Commercial Managed Care - PPO

## 2014-06-05 ENCOUNTER — Ambulatory Visit (HOSPITAL_BASED_OUTPATIENT_CLINIC_OR_DEPARTMENT_OTHER): Payer: Commercial Managed Care - PPO

## 2014-06-05 ENCOUNTER — Telehealth: Payer: Self-pay | Admitting: Oncology

## 2014-06-05 ENCOUNTER — Ambulatory Visit
Admission: RE | Admit: 2014-06-05 | Discharge: 2014-06-05 | Disposition: A | Discharge status: Home / Self Care | Payer: Commercial Managed Care - PPO | Source: Ambulatory Visit | Attending: Radiation Oncology | Admitting: Radiation Oncology

## 2014-06-05 ENCOUNTER — Other Ambulatory Visit: Payer: Self-pay | Admitting: Nurse Practitioner

## 2014-06-05 ENCOUNTER — Ambulatory Visit (HOSPITAL_BASED_OUTPATIENT_CLINIC_OR_DEPARTMENT_OTHER): Payer: Commercial Managed Care - PPO | Admitting: Nurse Practitioner

## 2014-06-05 VITALS — BP 131/75 | HR 80 | Temp 97.3°F | Resp 18 | Ht 69.0 in | Wt 183.7 lb

## 2014-06-05 DIAGNOSIS — C20 Malignant neoplasm of rectum: Secondary | ICD-10-CM

## 2014-06-05 DIAGNOSIS — I10 Essential (primary) hypertension: Secondary | ICD-10-CM

## 2014-06-05 LAB — CBC WITH DIFFERENTIAL/PLATELET
BASO%: 0.8 % (ref 0.0–2.0)
Basophils Absolute: 0 10*3/uL (ref 0.0–0.1)
EOS ABS: 0.1 10*3/uL (ref 0.0–0.5)
EOS%: 4.1 % (ref 0.0–7.0)
HEMATOCRIT: 39.7 % (ref 38.4–49.9)
HGB: 13.3 g/dL (ref 13.0–17.1)
LYMPH#: 0.6 10*3/uL — AB (ref 0.9–3.3)
LYMPH%: 22.1 % (ref 14.0–49.0)
MCH: 29.7 pg (ref 27.2–33.4)
MCHC: 33.5 g/dL (ref 32.0–36.0)
MCV: 88.9 fL (ref 79.3–98.0)
MONO#: 0.5 10*3/uL (ref 0.1–0.9)
MONO%: 18.8 % — ABNORMAL HIGH (ref 0.0–14.0)
NEUT%: 54.2 % (ref 39.0–75.0)
NEUTROS ABS: 1.4 10*3/uL — AB (ref 1.5–6.5)
PLATELETS: 230 10*3/uL (ref 140–400)
RBC: 4.47 10*6/uL (ref 4.20–5.82)
RDW: 14 % (ref 11.0–14.6)
WBC: 2.6 10*3/uL — AB (ref 4.0–10.3)

## 2014-06-05 NOTE — Progress Notes (Signed)
  Darren Hill OFFICE PROGRESS NOTE   Diagnosis:  Rectal cancer.  INTERVAL HISTORY:   Darren Hill returns as scheduled. He continues radiation and Xeloda. He denies nausea/vomiting. No mouth sores. No diarrhea. He has frequent formed stools. The rectal bleeding has resolved. He notes mild abdominal pain prior to a bowel movement. He has occasional pain at the right hip. No hand or foot pain or redness. He reports a good appetite. No fevers, sweats or chills. He denies shortness of breath.  Objective:  Vital signs in last 24 hours:  Blood pressure 131/75, pulse 80, temperature 97.3 F (36.3 C), temperature source Oral, resp. rate 18, height 5\' 9"  (1.753 m), weight 183 lb 11.2 oz (83.326 kg).    HEENT: No thrush or ulcerations. Lymphatics: No palpable cervical or supraclavicular lymph nodes. Resp: Lungs clear. Cardio: Regular cardiac rhythm. GI: Abdomen soft and nontender. No organomegaly. No erythema or skin breakdown at the perineum. Small superficial ulceration at the lower gluteal fold. Vascular: No leg edema. Skin: Palms without erythema.    Lab Results:  Lab Results  Component Value Date   WBC 3.2* 05/19/2014   HGB 13.1 05/19/2014   HCT 39.6 05/19/2014   MCV 87.3 05/19/2014   PLT 231 05/19/2014   NEUTROABS 1.5 05/19/2014    Imaging:  No results found.  Medications: I have reviewed the patient's current medications.  Assessment/Plan: 1. 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 2. Hypertension.   Disposition: Darren Hill appears stable. Plan to continue radiation/Xeloda. He will return to the lab today for a CBC.  He is scheduled to complete the course of radiation on 06/16/2014. He understands to discontinue Xeloda coinciding with completion of  radiation.   He has a followup visit with Dr. Johney Maine on 06/23/2014. He will return for a followup visit here on 07/22/2014. He will contact the office in the interim with any problems.  Plan reviewed with Dr. Benay Spice.    Ned Card ANP/GNP-BC   06/05/2014  9:21 AM

## 2014-06-05 NOTE — Telephone Encounter (Signed)
gv and printed appt sched and avs for pt for Aug...sent pt to lab

## 2014-06-06 ENCOUNTER — Encounter: Payer: Self-pay | Admitting: Radiation Oncology

## 2014-06-06 ENCOUNTER — Telehealth: Payer: Self-pay | Admitting: Oncology

## 2014-06-06 ENCOUNTER — Ambulatory Visit
Admission: RE | Admit: 2014-06-06 | Discharge: 2014-06-06 | Disposition: A | Discharge status: Home / Self Care | Payer: Commercial Managed Care - PPO | Source: Ambulatory Visit | Attending: Radiation Oncology | Admitting: Radiation Oncology

## 2014-06-06 VITALS — BP 134/79 | HR 74 | Temp 97.3°F | Resp 20 | Wt 185.8 lb

## 2014-06-06 DIAGNOSIS — C20 Malignant neoplasm of rectum: Secondary | ICD-10-CM

## 2014-06-06 NOTE — Telephone Encounter (Signed)
lvm for pt regarding to July lab added.Marland KitchenMarland Kitchen

## 2014-06-06 NOTE — Progress Notes (Addendum)
Weekly rad txs 22 completed rectal ,last labs 06/05/14,  Regular bowel movements, no bladder changes,  Using neosporin on small peeling of skin tear  in between buttocks, uses after rad txs, good appetite, drinking plenty water no pain, no bleeding 8:54 AM

## 2014-06-06 NOTE — Progress Notes (Signed)
  Radiation Oncology         [phone] ________________________________  Name: Darren Hill MRN: [id number]  Date: 06/06/2014  DOB: 01-Apr-1966  COMPLEX SIMULATION  NOTE  Diagnosis: rectal cancer  Narrative The patient has initially been planned to receive a course of radiation treatment to a dose of 45 gray in 25 fractions at 1.8 gray per fraction. The patient will now receive a boost to the high risk target volume for an additional 5.4 gray. This will be delivered in 3 fractions at 1.8 gray per fraction and a cone down boost technique will be utilized. To accomplish this, an additional 4 customized blocks have been designed for this purpose. A complex isodose plan is requested to ensure that the high-risk target region receives the appropriate radiation dose and that the nearby normal structures continue to be appropriately spared. The patient's final total dose therefore will be 50.4 gray.   ________________________________ ------------------------------------------------  Jodelle Gross, MD, PhD

## 2014-06-06 NOTE — Progress Notes (Signed)
   Department of Radiation Oncology  Phone:  [phone] Fax:        [phone]  Weekly Treatment Note    Name: [name] Date: 06/06/2014 MRN: [id number] DOB: 1966-05-24   Current dose:390.6 Gy  Current fraction: 22   MEDICATIONS: Current Outpatient Prescriptions  Medication Sig Dispense Refill  . capecitabine (XELODA) 500 MG tablet Take 4 tablets by mouth in  the morning and 3 tablets  by mouth in the evening  Monday thru Friday.  56 tablet  0  . lisinopril (PRINIVIL,ZESTRIL) 10 MG tablet Take 10 mg by mouth daily.      . metroNIDAZOLE (FLAGYL) 500 MG tablet Take 1 tablet (500 mg total) by mouth as directed. Take 2 pills (=1000mg ) by mouth at 1pm, 3pm, and 10pm the day before your colorectal operation as discussed in [place] office.  Call [phone] with questions  6 tablet  0  . neomycin (MYCIFRADIN) 500 MG tablet Take 2 tablets (1,000 mg total) by mouth as directed. Take 2 pills (=1000mg ) by mouth at 1pm, 3pm, and 10pm the day before your colorectal operation as discussed in [place] office.  Call [phone] with questions  6 tablet  0   No current facility-administered medications for this encounter.     ALLERGIES: Review of patient's allergies indicates no known allergies.   LABORATORY DATA:  Lab Results  Component Value Date   WBC 2.6* 06/05/2014   HGB 13.3 06/05/2014   HCT 39.7 06/05/2014   MCV 88.9 06/05/2014   PLT 230 06/05/2014   Lab Results  Component Value Date   NA 139 04/15/2014   K 3.9 04/15/2014   CL 106 03/05/2011   CO2 25 04/15/2014   Lab Results  Component Value Date   ALT 9 04/15/2014   AST 17 04/15/2014   ALKPHOS 48 04/15/2014   BILITOT 0.34 04/15/2014     NARRATIVE: Darren Hill was seen today for weekly treatment management. The chart was checked and the patient's films were reviewed. The patient is doing well. He continues to have no major problems. Good appetite. He does have 1 small area of irritation which has continued in the buttock region.  He denies any major difficulties in terms of pain with this. He is using Neosporin on this.  PHYSICAL EXAMINATION: weight is 185 lb 12.8 oz (84.278 kg). His oral temperature is 97.3 F (36.3 C). His blood pressure is 134/79 and his pulse is 74. His respiration is 20.      one small area at midline represents a small area of desquamation. This is very limited in overall his skin is doing very well.  ASSESSMENT: The patient is doing satisfactorily with treatment.  PLAN: We will continue with the patient's radiation treatment as planned.

## 2014-06-09 ENCOUNTER — Ambulatory Visit
Admission: RE | Admit: 2014-06-09 | Discharge: 2014-06-09 | Disposition: A | Discharge status: Home / Self Care | Payer: Commercial Managed Care - PPO | Source: Ambulatory Visit | Attending: Radiation Oncology | Admitting: Radiation Oncology

## 2014-06-10 ENCOUNTER — Ambulatory Visit: Payer: Commercial Managed Care - PPO

## 2014-06-10 ENCOUNTER — Ambulatory Visit
Admission: RE | Admit: 2014-06-10 | Discharge: 2014-06-10 | Disposition: A | Discharge status: Home / Self Care | Payer: Commercial Managed Care - PPO | Source: Ambulatory Visit | Attending: Radiation Oncology | Admitting: Radiation Oncology

## 2014-06-11 ENCOUNTER — Other Ambulatory Visit (HOSPITAL_BASED_OUTPATIENT_CLINIC_OR_DEPARTMENT_OTHER): Payer: Commercial Managed Care - PPO

## 2014-06-11 ENCOUNTER — Ambulatory Visit
Admission: RE | Admit: 2014-06-11 | Discharge: 2014-06-11 | Disposition: A | Discharge status: Home / Self Care | Payer: Commercial Managed Care - PPO | Source: Ambulatory Visit | Attending: Radiation Oncology | Admitting: Radiation Oncology

## 2014-06-11 ENCOUNTER — Telehealth: Payer: Self-pay | Admitting: *Deleted

## 2014-06-11 DIAGNOSIS — C20 Malignant neoplasm of rectum: Secondary | ICD-10-CM

## 2014-06-11 LAB — CBC WITH DIFFERENTIAL/PLATELET
BASO%: 0.8 % (ref 0.0–2.0)
BASOS ABS: 0 10*3/uL (ref 0.0–0.1)
EOS%: 3.8 % (ref 0.0–7.0)
Eosinophils Absolute: 0.1 10*3/uL (ref 0.0–0.5)
HEMATOCRIT: 38.8 % (ref 38.4–49.9)
HEMOGLOBIN: 12.9 g/dL — AB (ref 13.0–17.1)
LYMPH%: 17.1 % (ref 14.0–49.0)
MCH: 29.6 pg (ref 27.2–33.4)
MCHC: 33.2 g/dL (ref 32.0–36.0)
MCV: 89.1 fL (ref 79.3–98.0)
MONO#: 0.5 10*3/uL (ref 0.1–0.9)
MONO%: 21.7 % — ABNORMAL HIGH (ref 0.0–14.0)
NEUT#: 1.4 10*3/uL — ABNORMAL LOW (ref 1.5–6.5)
NEUT%: 56.6 % (ref 39.0–75.0)
PLATELETS: 242 10*3/uL (ref 140–400)
RBC: 4.35 10*6/uL (ref 4.20–5.82)
RDW: 14.4 % (ref 11.0–14.6)
WBC: 2.5 10*3/uL — ABNORMAL LOW (ref 4.0–10.3)
lymph#: 0.4 10*3/uL — ABNORMAL LOW (ref 0.9–3.3)

## 2014-06-11 NOTE — Telephone Encounter (Signed)
Spoke with pt, he will have only #2 Xeloda tablets on final day of radiation. Asks if he should have #5 tablets ordered? Reviewed with Dr. Benay Spice: OK to finish radiation with last 2 tabs. Pt voiced understanding.

## 2014-06-11 NOTE — Telephone Encounter (Signed)
Message copied by Wardell Heath on Wed Jun 11, 2014  4:31 PM ------      Message from: [place], [place] K      Created: Wed Jun 11, 2014  4:04 PM       Please let him know labs are stable.       ----- Message -----         From: [facility] Interface         Sent: 06/11/2014   8:52 AM           To: Owens Shark, NP                   ------

## 2014-06-11 NOTE — Telephone Encounter (Signed)
Called and informed patient of stable labs.  Per Elby Showers. Thomas.  Patient verbalized understanding.

## 2014-06-12 ENCOUNTER — Ambulatory Visit
Admission: RE | Admit: 2014-06-12 | Discharge: 2014-06-12 | Disposition: A | Discharge status: Home / Self Care | Payer: Commercial Managed Care - PPO | Source: Ambulatory Visit | Attending: Radiation Oncology | Admitting: Radiation Oncology

## 2014-06-12 DIAGNOSIS — C20 Malignant neoplasm of rectum: Secondary | ICD-10-CM

## 2014-06-12 NOTE — Progress Notes (Signed)
  Radiation Oncology         [phone] ________________________________  Name: Darren Hill MRN: [id number]  Date: 06/12/2014  DOB: Aug 30, 1966  Simulation Verification Note  Status: outpatient  NARRATIVE: The patient was brought to the treatment unit and placed in the planned treatment position. The clinical setup was verified. Then port films were obtained and uploaded to the radiation oncology medical record software.  The treatment beams were carefully compared against the planned radiation fields. The position location and shape of the radiation fields was reviewed. They targeted volume of tissue appears to be appropriately covered by the radiation beams. Organs at risk appear to be excluded as planned.  Based on my personal review, I approved the simulation verification. The patient's treatment will proceed as planned.  ------------------------------------------------  Sheral Apley Tammi Klippel, M.D.

## 2014-06-13 ENCOUNTER — Ambulatory Visit
Admission: RE | Admit: 2014-06-13 | Discharge: 2014-06-13 | Disposition: A | Discharge status: Home / Self Care | Payer: Commercial Managed Care - PPO | Source: Ambulatory Visit | Attending: Radiation Oncology | Admitting: Radiation Oncology

## 2014-06-13 ENCOUNTER — Ambulatory Visit: Payer: Commercial Managed Care - PPO

## 2014-06-13 ENCOUNTER — Encounter: Payer: Self-pay | Admitting: Radiation Oncology

## 2014-06-13 VITALS — BP 131/95 | HR 61 | Temp 97.6°F | Resp 20 | Wt 183.2 lb

## 2014-06-13 DIAGNOSIS — C20 Malignant neoplasm of rectum: Secondary | ICD-10-CM

## 2014-06-13 NOTE — Progress Notes (Signed)
  Radiation Oncology         [phone] ________________________________  Name: Darren Hill MRN: [id number]  Date: 06/13/2014  DOB: 1965/12/25  Weekly Radiation Therapy Management  Current Dose: 48.6 Gy     Planned Dose:  50.4 Gy  Narrative . . . . . . . . The patient presents for routine under treatment assessment.                                   The patient is without complaint. Weekly rad txs rectal , 27/28 completed, regular bowel movements, frequency voiding but drinks water and tea all day up till 9pm, says that's why he goes a lot stated, no hematuria, does use slight amount neosporin in between buttocks skin irritation, good appetite no c/o,                                  Set-up films were reviewed.                                 The chart was checked. Physical Findings. . .  weight is 183 lb 3.2 oz (83.099 kg). His oral temperature is 97.6 F (36.4 C). His blood pressure is 131/95 and his pulse is 61. His respiration is 20. . Weight essentially stable.  No significant changes. Impression . . . . . . . The patient is tolerating radiation. Plan . . . . . . . . . . . . Continue treatment as planned.  He has 1 month f/u appt card  ________________________________  Sheral Apley. Tammi Klippel, M.D.

## 2014-06-13 NOTE — Progress Notes (Signed)
Weekly rad txs rectal , 27/28 completed, regular bowel movements, frequency voiding but drinks water and tea  all day up till 9pm, says that's why he goes a lot stated, no hematuria, does use slight amount neosporin in between buttocks skin irritation, good appetite no c/o, has 1 month f/u appt card 8:54 AM

## 2014-06-16 ENCOUNTER — Ambulatory Visit: Payer: Commercial Managed Care - PPO

## 2014-06-16 ENCOUNTER — Encounter: Payer: Self-pay | Admitting: Radiation Oncology

## 2014-06-16 ENCOUNTER — Ambulatory Visit
Admission: RE | Admit: 2014-06-16 | Discharge: 2014-06-16 | Disposition: A | Discharge status: Home / Self Care | Payer: Commercial Managed Care - PPO | Source: Ambulatory Visit | Attending: Radiation Oncology | Admitting: Radiation Oncology

## 2014-06-23 ENCOUNTER — Ambulatory Visit (INDEPENDENT_AMBULATORY_CARE_PROVIDER_SITE_OTHER): Payer: Commercial Managed Care - PPO | Admitting: Surgery

## 2014-06-23 ENCOUNTER — Encounter (INDEPENDENT_AMBULATORY_CARE_PROVIDER_SITE_OTHER): Payer: Self-pay | Admitting: Surgery

## 2014-06-23 VITALS — BP 102/70 | HR 68 | Resp 16 | Ht 69.0 in | Wt 182.0 lb

## 2014-06-23 DIAGNOSIS — C20 Malignant neoplasm of rectum: Secondary | ICD-10-CM

## 2014-06-23 MED ORDER — METRONIDAZOLE 500 MG PO TABS: 500.0000 mg | ORAL_TABLET | ORAL | Status: DC

## 2014-06-23 MED ORDER — NEOMYCIN SULFATE 500 MG PO TABS: 1000.0000 mg | ORAL_TABLET | ORAL | Status: DC

## 2014-06-23 NOTE — Progress Notes (Signed)
Subjective:     Patient ID: Darren Hill, male   DOB: 1966-08-19, 48 y.o.   MRN: [id number]  HPI   Note: This dictation was prepared with Dragon/digital dictation along with [facility] technology. Any transcriptional errors that result from this process are unintentional.       Darren Hill  01/04/1966 [id number]  Patient Care Team: Ladell Pier, MD as PCP - General (Oncology) Ladell Pier, MD as Consulting Physician (Oncology) Carola Frost, RN as Registered Nurse Marye Round, MD as Consulting Physician (Radiation Oncology) Missy Sabins, MD as Consulting Physician (Gastroenterology) [name], MD as Consulting Physician (General Surgery) Josue Hector, MD as Consulting Physician (Cardiology)  This patient is a 48 y.o.male who presents today for surgical evaluation at the request of Dr. Benay Spice.   Reason for visit: Rectal cancer  Pleasant truck driver.  Has had persistent rectal bleeding.    He was taken to a colonoscopy by Dr. Teena Irani on 03/31/2014. A sessile nonobstructing mass was found in the proximal rectum from 12-15 cm from the anal verge. Oozing was present. A biopsy was obtained. The area was tattooed. The exam was otherwise normal. The pathology ([id number]) revealed adenocarcinoma.  Dr. Paulita Fujita performed an endoscopic ultrasound 04/03/2012. The mass was measured at 10-15 cm from the anal verge. The mass was largely confined to the rectal wall, but an approximate 15 mm segment corresponded to the area of central ulceration where tumor was noted to penetrate through the muscularis propria. No peritumoral adenopathy. The tumor was staged as an ultrasound T3 N0 lesion.  He underwent staging CTs of the chest, abdomen, and pelvis on 04/04/2014. No suspicious pulmonary nodule. The liver appeared normal. An intraluminal mass was noted in the superior rectum measuring approximately 2.1 x 2.9 cm. No evidence of perirectal or other pelvic lymphadenopathy. No  abdominal lymphadenopathy.  Surgical consultation was requested.  He comes today with his wife.  Did not want a rectal exam today.  Afraid of any ostomy.  Many questions and concerns.  Really physically active.  He can walk 2 miles without difficulty.  No history of skin infections her MRSA.  No prior abdominal surgeries.  Had episode of chest pain 4 years ago with negative cardiac workup.  Felt most likely to be related to heartburn..  BM 2x/day.    No family history of GI/colon cancer, inflammatory bowel disease, irritable bowel syndrome, allergy such as Celiac Sprue, dietary/dairy problems, colitis, ulcers nor gastritis.  No recent sick contacts/gastroenteritis.  No travel outside the country.  No changes in diet.  No dysphagia to solids or liquids.  No significant heartburn or reflux.  No hematochezia, hematemesis, coffee ground emesis.  No evidence of prior gastric/peptic ulceration.  He is undergone neoadjuvant chemoradiation therapy with oral Xeloda.  Finished this regimen 7 days ago, 20 July.  Feeling somewhat raw & sensitive back there.  No bleeding.  Mild discomfort with defecation.  No urgency.  No problems with urination.  Worried about having an ostomy post-op.  Patient Active Problem List   Diagnosis Date Noted  . Rectal cancer 04/10/2014  . PALPITATIONS 06/28/2010  . CHEST PAIN 06/28/2010  . ABNORMAL ELECTROCARDIOGRAM 06/28/2010    Past Medical History  Diagnosis Date  . Hypertension   . Colon cancer 04/03/14    rectal mass, colon ca    Past Surgical History  Procedure Laterality Date  . Eus N/A 04/03/2014    Procedure: LOWER ENDOSCOPIC ULTRASOUND (EUS);  Surgeon:  Arta Silence, MD;  Location: [facility] ENDOSCOPY;  Service: Endoscopy;  Laterality: N/A;  H/P in file cabinet ja  . Colon biopsy N/A 04/03/14    rectal mass=adenocarcinoma    History   Social History  . Marital Status: Married    Spouse Name: N/A    Number of Children: 2  . Years of Education: N/A   Occupational  History  . TRUCK DRIVER    Social History Main Topics  . Smoking status: Former Smoker -- 0.25 packs/day for 15 years    Types: Cigarettes    Quit date: 04/03/2006  . Smokeless tobacco: Never Used  . Alcohol Use: No  . Drug Use: No  . Sexual Activity: Yes   Other Topics Concern  . Not on file   Social History Narrative  . No narrative on file    Family History  Problem Relation Age of Onset  . Brain cancer Maternal Aunt     not sure if tumor did operation on brain    Current Outpatient Prescriptions  Medication Sig Dispense Refill  . capecitabine (XELODA) 500 MG tablet Take 4 tablets by mouth in  the morning and 3 tablets  by mouth in the evening  Monday thru Friday.  56 tablet  0  . lisinopril (PRINIVIL,ZESTRIL) 10 MG tablet Take 10 mg by mouth daily.      . metroNIDAZOLE (FLAGYL) 500 MG tablet Take 1 tablet (500 mg total) by mouth as directed. Take 2 pills (=1000mg ) by mouth at 1pm, 3pm, and 10pm the day before your colorectal operation as discussed in [place] office.  Call [phone] with questions  6 tablet  0  . neomycin (MYCIFRADIN) 500 MG tablet Take 2 tablets (1,000 mg total) by mouth as directed. Take 2 pills (=1000mg ) by mouth at 1pm, 3pm, and 10pm the day before your colorectal operation as discussed in [place] office.  Call [phone] with questions  6 tablet  0   No current facility-administered medications for this visit.     No Known Allergies  BP 102/70  Pulse 68  Resp 16  Ht 5\' 9"  (1.753 m)  Wt 182 lb (82.555 kg)  BMI 26.86 kg/m2  Ct Chest W Contrast  04/04/2014   CLINICAL DATA:  Newly diagnosed rectal carcinoma.  EXAM: CT CHEST, ABDOMEN, AND PELVIS WITH CONTRAST  TECHNIQUE: Multidetector CT imaging of the chest, abdomen and pelvis was performed following the standard protocol during bolus administration of intravenous contrast.  CONTRAST:  173mL OMNIPAQUE IOHEXOL 300 MG/ML  SOLN  COMPARISON:  None.  FINDINGS: CT CHEST FINDINGS  No evidence of mediastinal or  hilar masses. No lymphadenopathy seen elsewhere within the thorax. No evidence of pleural or pericardial effusion.  No suspicious pulmonary nodules or masses are identified. Mild scarring noted in the inferior aspect of the lingula. No evidence of pulmonary airspace disease or central endobronchial lesion. No evidence of chest wall mass or suspicious bone lesions.  CT ABDOMEN AND PELVIS FINDINGS  The liver, gallbladder, pancreas, spleen, adrenal glands, and kidneys are normal in appearance. No evidence of hydronephrosis.  An intraluminal mass is seen in the superior rectum measuring approximately 2.1 x 2.9 cm on image 103. No evidence of perirectal or other pelvic lymphadenopathy. No evidence of abdominal lymphadenopathy. No other soft tissue masses identified. No evidence of bowel obstruction.  No evidence of inflammatory process or abnormal fluid collections. No suspicious bone lesions identified.  IMPRESSION: 2.9 cm intraluminal mass in the superior rectum, consistent with known rectal  carcinoma.  No evidence of local lymphadenopathy or distant metastatic disease.   Electronically Signed   By: Earle Gell M.D.   On: 04/04/2014 18:13   Ct Abdomen Pelvis W Contrast  04/04/2014   CLINICAL DATA:  Newly diagnosed rectal carcinoma.  EXAM: CT CHEST, ABDOMEN, AND PELVIS WITH CONTRAST  TECHNIQUE: Multidetector CT imaging of the chest, abdomen and pelvis was performed following the standard protocol during bolus administration of intravenous contrast.  CONTRAST:  170mL OMNIPAQUE IOHEXOL 300 MG/ML  SOLN  COMPARISON:  None.  FINDINGS: CT CHEST FINDINGS  No evidence of mediastinal or hilar masses. No lymphadenopathy seen elsewhere within the thorax. No evidence of pleural or pericardial effusion.  No suspicious pulmonary nodules or masses are identified. Mild scarring noted in the inferior aspect of the lingula. No evidence of pulmonary airspace disease or central endobronchial lesion. No evidence of chest wall mass or  suspicious bone lesions.  CT ABDOMEN AND PELVIS FINDINGS  The liver, gallbladder, pancreas, spleen, adrenal glands, and kidneys are normal in appearance. No evidence of hydronephrosis.  An intraluminal mass is seen in the superior rectum measuring approximately 2.1 x 2.9 cm on image 103. No evidence of perirectal or other pelvic lymphadenopathy. No evidence of abdominal lymphadenopathy. No other soft tissue masses identified. No evidence of bowel obstruction.  No evidence of inflammatory process or abnormal fluid collections. No suspicious bone lesions identified.  IMPRESSION: 2.9 cm intraluminal mass in the superior rectum, consistent with known rectal carcinoma.  No evidence of local lymphadenopathy or distant metastatic disease.   Electronically Signed   By: Earle Gell M.D.   On: 04/04/2014 18:13     Review of Systems  Constitutional: Negative for fever, chills and diaphoresis.  HENT: Negative for ear discharge, facial swelling, mouth sores, nosebleeds, sore throat and trouble swallowing.   Eyes: Negative for photophobia, discharge and visual disturbance.  Respiratory: Negative for choking, chest tightness, shortness of breath and stridor.   Cardiovascular: Negative for chest pain and palpitations.  Gastrointestinal: Positive for rectal pain. Negative for nausea, vomiting, abdominal pain, diarrhea, constipation, blood in stool and abdominal distention.  Endocrine: Negative for cold intolerance and heat intolerance.  Genitourinary: Negative for dysuria, urgency, difficulty urinating and testicular pain.  Musculoskeletal: Negative for arthralgias, back pain, gait problem and myalgias.  Skin: Negative for color change, pallor, rash and wound.  Allergic/Immunologic: Negative for environmental allergies and food allergies.  Neurological: Negative for dizziness, speech difficulty, weakness, numbness and headaches.  Hematological: Negative for adenopathy. Does not bruise/bleed easily.   Psychiatric/Behavioral: Negative for hallucinations, confusion and agitation.       Objective:   Physical Exam  Constitutional: He is oriented to person, place, and time. He appears well-developed and well-nourished. No distress.  HENT:  Head: Normocephalic.  Mouth/Throat: Oropharynx is clear and moist. No oropharyngeal exudate.  Eyes: Conjunctivae and EOM are normal. Pupils are equal, round, and reactive to light. No scleral icterus.  Neck: Normal range of motion. Neck supple. No tracheal deviation present.  Cardiovascular: Normal rate, regular rhythm and intact distal pulses.   Pulmonary/Chest: Effort normal and breath sounds normal. No respiratory distress.  Abdominal: Soft. He exhibits no distension. There is no tenderness. Hernia confirmed negative in the right inguinal area and confirmed negative in the left inguinal area.  Genitourinary: Prostate normal, testes normal and penis normal. Rectal exam shows no fissure.  Perianal skin clean with good hygiene.  No pruritis.  No pilonidal disease.  No fissure.  No abscess/fistula.  No  external hemorrhoids.  Normal sphincter tone.  Tolerates digital rectal exam.   Hemorrhoidal piles WNL.  Mobile anterior rectal wall mass ~9cm from anal verge - less bulky   Musculoskeletal: Normal range of motion. He exhibits no tenderness.  Lymphadenopathy:    He has no cervical adenopathy.       Right: No inguinal adenopathy present.       Left: No inguinal adenopathy present.  Neurological: He is alert and oriented to person, place, and time. No cranial nerve deficit. He exhibits normal muscle tone. Coordination normal.  Skin: Skin is warm and dry. No rash noted. He is not diaphoretic. No erythema. No pallor.  Psychiatric: He has a normal mood and affect. His behavior is normal. Judgment and thought content normal.       Assessment:     Anterior rectal cancer, status post neoadjuvant chemoradiation therapy Plan:     I think he would benefit  from low anterior resection surgery at some point.  Certainly sphincter sparing approach is reasonable.  He may need a diverting loop ileostomy if the anastomosis is distal to 5 cm, but because he is not a morbidly obese smoker hopefully we can avoid that.  Would have to make decision intraoperatively.  Might be able to do transanal extraction through the rectum with the help of the TEM system.  He is still very focused on avoiding any ostomy.  I again spent some time discussing with the patient in detail.  Questions were answered.  Multidisciplinary approach.  Probably will do rigid proctoscopy with tattooing of the cancer & Foley tube rectal decompression intraoperatively:  The anatomy & physiology of the digestive tract was discussed.  The pathophysiology was discussed.  Natural history risks without surgery was discussed.   I worked to give an overview of the disease and the frequent need to have multispecialty involvement.  I feel the risks of no intervention will lead to serious problems that outweigh the operative risks; therefore, I recommended a partial proctocolectomy to remove the pathology.  Minimally Invasive (Robotic/Laparoscopic) & open techniques were discussed.  We will work to preserve anal & pelvic floor function without sacrificing cure.  Risks such as bleeding, infection, abscess, leak, reoperation, possible ostomy, hernia, heart attack, death, and other risks were discussed.  I noted a good likelihood this will help address the problem.   Goals of post-operative recovery were discussed as well.  We will work to minimize complications.  An educational handout on the pathology was given as well.  Questions were answered.    The patient expresses understanding & wishes to proceed with surgery.  Plan surgery late September to early October He has a major church activity 24-26th of September and would like to do surgery after that  We did give instructions on bowel prep and antibiotics  again.   Colonoscopy for any siblings older than 50.

## 2014-06-23 NOTE — Patient Instructions (Signed)
Please consider the recommendations that we have given you today:  Consider minimally invasive robotic surgery to remove her rectal cancer in late September/early October.  May need protective diverting loop ileostomy.  We will have to make the call in the operating room.  Work on nutrition and exercise to keep your energy up to have the strength to tolerate the operation.  See the Handout(s) we have given you.  Please call our office at [phone] if you wish to schedule surgery or if you have further questions / concerns.   Colorectal Cancer Colorectal cancer is an abnormal growth of tissue (tumor) in the colon or rectum that is cancerous (malignant). Unlike noncancerous (benign) tumors, malignant tumors can spread to other parts of your body. The colon is the large bowel or large intestine. The rectum is the last several inches of the colon.  RISK FACTORS The exact cause of colorectal cancer is unknown. However, the following factors may increase your chances of getting colorectal cancer:   Age older than 73 years.   Abnormal growths (polyps) on the inner wall of the colon or rectum.   Diabetes.   African American race.   Family history of hereditary nonpolyposis colorectal cancer. This condition is caused by changes in the genes that are responsible for repairing mismatched DNA.   Personal history of cancer. A person who has already had colorectal cancer may develop it a second time. Also, women with a history of ovarian, uterine, or breast cancer are at a somewhat higher risk of developing colorectal cancer.  Certain hereditary conditions.  Eating a diet that is high in fat (especially animal fat) and low in fiber, fruits, and vegetables.  Sedentary lifestyle.  Inflammatory bowel disease, including ulcerative colitis and Crohn's disease.   Smoking.   Excessive alcohol use.  SYMPTOMS Early colorectal cancer often does not cause symptoms. As the cancer grows,  symptoms may include:   Changes in bowel habits.  Diarrhea.   Constipation.   Feeling like the bowel does not empty completely after a bowel movement.   Blood in the stool.   Stools that are narrower than usual.   Abdominal discomfort, pain, bloating, fullness, or cramps.  Frequent gas pain.   Unexplained weight loss.   Constant tiredness.   Nausea and vomiting.  DIAGNOSIS  Your health care provider will ask about your medical history. He or she may also perform a number of procedures, such as:   A physical exam.  A digital rectal exam.  A fecal occult blood test.  A barium enema.  Blood tests.   X-rays.   Imaging tests, such as CT scans or MRIs.   Taking a tissue sample (biopsy) from your colon or rectum to look for cancer cells.   A sigmoidoscopy to view the inside of the last part of your colon.   A colonoscopy to view the inside of your entire colon.   An endorectal ultrasound to see how deep a rectal tumor has grown and whether the cancer has spread to lymph nodes or other nearby tissues.  Your cancer will be staged to determine its severity and extent. Staging is a careful attempt to find out the size of the tumor, whether the cancer has spread, and if so, to what parts of the body. You may need to have more tests to determine the stage of your cancer. The test results will help determine what treatment plan is best for you.   Stage 0. The cancer is found  only in the innermost lining of the colon or rectum.   Stage I. The cancer has grown into the inner wall of the colon or rectum. The cancer has not yet reached the outer wall of the colon.   Stage II. The cancer extends more deeply into or through the wall of the colon or rectum. It may have invaded nearby tissue, but cancer cells have not spread to the lymph nodes.   Stage III. The cancer has spread to nearby lymph nodes but not to other parts of the body.   Stage IV. The cancer  has spread to other parts of the body, such as the liver or lungs.  Your health care provider may tell you the detailed stage of your cancer, which includes both a number and a letter.  TREATMENT  Depending on the type and stage, colorectal cancer may be treated with surgery, radiation therapy, chemotherapy, targeted therapy, or radiofrequency ablation. Some people have a combination of these therapies. Surgery may be done to remove the polyps from your colon. In early stages, your health care provider may be able to do this during a colonoscopy. In later stages, surgery may be done to remove part of your colon.  HOME CARE INSTRUCTIONS   Take medicines only as directed by your health care provider.   Maintain a healthy diet.   Consider joining a support group. This may help you learn to cope with the stress of having colorectal cancer.   Seek advice to help you manage treatment of side effects.   Keep all follow-up visits as directed by your health care provider.   Inform your cancer specialist if you are admitted to the hospital.  SEEK MEDICAL CARE IF:  Your diarrhea or constipation does not go away.   Your bowel habits change.  You have increased abdominal pain.   You notice new fatigue or weakness.  You lose weight. Document Released: 11/14/2005 Document Revised: 03/31/2014 Document Reviewed: 05/09/2013 [facility] Patient Information 2015 [place], [place]. This information is not intended to replace advice given to you by your health care provider. Make sure you discuss any questions you have with your health care provider.  COLON BOWEL PREP                                                                          Please follow the instructions carefully. It is important to clean out your bowels & take the prescribed antibiotic pills to lower your chances of a wound infection or abscess.   FIVE DAYS PRIOR TO YOUR SURGERY  Stop eating any nuts, popcorn, or fruit with  seeds. Stop all fiber supplements such as Metamucil, Citrucel, etc.   Hold taking any blood thinning anticoagulation medication (ex: aspirin, warfarin/Coumadin, Plavix, Xarelto, Eliquis, Pradaxa, etc) as recommended by your medical/cardiology doctor  Obtain what you need at a pharmacy of your choice: -Filled out prescriptions for your oral antibiotics (Neomycin & Metronidazole)  -A bottle of MiraLax / Glycolax (288g) - no prescription required  -A large bottle of Gatorade / Powerade (64oz)  -Dulcolax tablets (4 tabs) - no prescription required    DAY PRIOR TO SURGERY   7:00am Swallow 4 Dulcolax tablets with some water Drink plenty of  clear liquids all day to avoid getting dehydrated (Water, juice, soda, coffee, tea, bouillon, jello, etc.)  10:00am Mix the bottle of MiraLax with the 64-oz bottle of Gatorade.  Drink the Gatorade mixture gradually over the next few hours (8oz glass every 15-30 minutes) until gone. You should finish by 2pm.  2:00pm Take 2 Neomycin 540m tablets & 2 Metronidazole 5053mtablets  3:00pm Take 2 Neomycin 50034mablets & 2 Metronidazole 500m82mblets  Drink plenty of clear liquids all evening to avoid getting dehydrated  10:00pm Take 2 Neomycin 500mg82mlets & 2 Metronidazole 500mg 71mets  Do not eat or drink anything after bedtime (midnight) the night before your surgery.   MORNING OF SURGERY Remember to not to drink or eat anything that morning  Hold or take medications as recommended by the hospital staff at your Preoperative visit  If you have questions or concerns, please call [facility] [phone] during business hours to speak to the clinical staff for advice.  ABDOMINAL SURGERY: POST OP INSTRUCTIONS  1. DIET: Follow a light bland diet the first 24 hours after arrival home, such as soup, liquids, crackers, etc.  Be sure to include lots of fluids daily.  Avoid fast food or heavy meals as your are more likely to get nauseated.   Eat a low fat the next few days after surgery.   2. Take your usually prescribed home medications unless otherwise directed. 3. PAIN CONTROL: a. Pain is best controlled by a usual combination of three different methods TOGETHER: i. Ice/Heat ii. Over the counter pain medication iii. Prescription pain medication b. Most patients will experience some swelling and bruising around the incisions.  Ice packs or heating pads (30-60 minutes up to 6 times a day) will help. Use ice for the first few days to help decrease swelling and bruising, then switch to heat to help relax tight/sore spots and speed recovery.  Some people prefer to use ice alone, heat alone, alternating between ice & heat.  Experiment to what works for you.  Swelling and bruising can take several weeks to resolve.   c. It is helpful to take an over-the-counter pain medication regularly for the first few weeks.  Choose one of the following that works best for you: i. Naproxen (Aleve, etc)  Two 220mg t36mtwice a day ii. Ibuprofen (Advil, etc) Three 200mg ta26mour times a day (every meal & bedtime) iii. Acetaminophen (Tylenol, etc) 500-650mg fou49mmes a day (every meal & bedtime) d. A  prescription for pain medication (such as oxycodone, hydrocodone, etc) should be given to you upon discharge.  Take your pain medication as prescribed.  i. If you are having problems/concerns with the prescription medicine (does not control pain, nausea, vomiting, rash, itching, etc), please call us [phone] we need to switch you to a different pain medicine that will work better for you and/or control your side effect better. ii. If you need a refill on your pain medication, please contact your pharmacy.  They will contact our office to request authorization. Prescriptions will not be filled after 5 pm or on week-ends. 4. Avoid getting constipated.  Between the surgery and the pain medications, it is common to experience some constipation.   Increasing fluid intake and taking a fiber supplement (such as Metamucil, Citrucel, FiberCon, MiraLax, etc) 1-2 times a day regularly will usually help prevent this problem from occurring.  A mild laxative (prune juice, Milk of Magnesia, MiraLax, etc) should be taken  according to package directions if there are no bowel movements after 48 hours.   5. Watch out for diarrhea.  If you have many loose bowel movements, simplify your diet to bland foods & liquids for a few days.  Stop any stool softeners and decrease your fiber supplement.  Switching to mild anti-diarrheal medications (Kayopectate, Pepto Bismol) can help.  If this worsens or does not improve, please call us. 6. Wash / shower every day.  You may shower over the incision / wound.  Avoid baths until the skin is fully healed.  Continue to shower over incision(s) after the dressing is off. 7. Remove your waterproof bandages 5 days after surgery.  You may leave the incision open to air.  You may replace a dressing/Band-Aid to cover the incision for comfort if you wish. 8. ACTIVITIES as tolerated:   a. You may resume regular (light) daily activities beginning the next day-such as daily self-care, walking, climbing stairs-gradually increasing activities as tolerated.  If you can walk 30 minutes without difficulty, it is safe to try more intense activity such as jogging, treadmill, bicycling, low-impact aerobics, swimming, etc. b. Save the most intensive and strenuous activity for last such as sit-ups, heavy lifting, contact sports, etc  Refrain from any heavy lifting or straining until you are off narcotics for pain control.   c. DO NOT PUSH THROUGH PAIN.  Let pain be your guide: If it hurts to do something, don't do it.  Pain is your body warning you to avoid that activity for another week until the pain goes down. d. You may drive when you are no longer taking prescription pain medication, you can comfortably wear a seatbelt, and you can safely maneuver  your car and apply brakes. e. Dennis Bast may have sexual intercourse when it is comfortable.  9. FOLLOW UP in our office a. Please call [facility] at [phone] to set up an appointment to see your surgeon in the office for a follow-up appointment approximately 1-2 weeks after your surgery. b. Make sure that you call for this appointment the day you arrive home to insure a convenient appointment time. 10. IF YOU HAVE DISABILITY OR FAMILY LEAVE FORMS, BRING THEM TO THE OFFICE FOR PROCESSING.  DO NOT GIVE THEM TO YOUR DOCTOR.   WHEN TO CALL us [phone]: 1. Poor pain control 2. Reactions / problems with new medications (rash/itching, nausea, etc)  3. Fever over 101.5 F (38.5 C) 4. Inability to urinate 5. Nausea and/or vomiting 6. Worsening swelling or bruising 7. Continued bleeding from incision. 8. Increased pain, redness, or drainage from the incision  The clinic staff is available to answer your questions during regular business hours (8:30am-5pm).  Please don't hesitate to call and ask to speak to one of our nurses for clinical concerns.   A surgeon from [facility] is always on call at the hospitals   If you have a medical emergency, go to the nearest emergency room or call 911.    [facility], [place], [place], [place], [place]  [address] ? MAIN: [phone] ? TOLL FREE: [phone] ? FAX [phone] [email or web address]   Ostomy Support Information  Yes, you've heard that people get along just fine with only one of their eyes, or one of their lungs, or one of their kidneys. But you also know that you have only one intestine and only one bladder, and that leaves you feeling awfully empty, both physically and emotionally: You think  no other people go around without part of their intestine with the ends of their intestines sticking out through their abdominal walls.  Well, you are wrong! There are nearly three quarters of a  million people in the [place] who have an ostomy; people who have had surgery to remove all or part of their colons or bladders. There is even a national association, the [place] [employer] [place] with over 350 local affiliated support groups that are organized by volunteers who provide peer support and counseling. [place] has a toll free telephone num-ber, [phone] and an educational,  interactive website, www.ostomy.org   An ostomy is an opening in the belly (abdominal wall) made by surgery. Ostomates are people who have had this procedure. The opening (stoma) allows the kidney or bowel to discharge waste. An external pouch covers the stoma to collect waste. Pouches are are a simple bag and are odor free. Different companies have disposable or reusable pouches to fit one's lifestyle. An ostomy can either be temporary or permanent.  THERE ARE THREE MAIN TYPES OF OSTOMIES  Colostomy. A colostomy is a surgically created opening in the large intestine (colon).  Ileostomy. An ileostomy is a surgically created opening in the small intestine.  Urostomy. A urostomy is a surgically created opening to divert urine away from the bladder. FREQUENTLY ASKED QUESTIONS   Why haven't you met any of these folks who have an ostomy?  Well, maybe you have! You just did not recognize them because an ostomy doesn't show. It can be kept secret if you wish. Why, maybe some of your best friends, office associates or neighbors have an ostomy ... you never can tell.   People facing ostomy surgery have many quality-of-life questions like:  Will you bulge? Smell? Make noises? Will you feel waste leaving your body? Will you be a captive of the toilet? Will you starve? Be a social outcast? Get/stay married? Have babies? Easily bathe, go swimming, bend over?  OK, let's look at what you can expect:  Will you bulge?  Remember, without part of the intestine or bladder, and its contents, you should have a flatter tummy  than before. You can expect to wear, with little exception, what you wore before surgery ... and this in-cludes tight clothing and bathing suits.  Will you smell?  Today, thanks to modern odor proof pouching systems, you can walk into an ostomy support group meeting and not smell anything that is foul or offensive. And, for those with an ileostomy or colostomy who are concerned about odor when emptying their pouch, there are in-pouch deodorants that can be used to eliminate any waste odors that may exist.  Will you make noises?  Everyone produces gas, especially if they are an air-swallower. But intestinal sounds that occur from time to time are no differ-ent than a gurgling tummy, and quite often your clothing will muffle any sounds.   Will you feel the waste discharges?  For those with a colostomy or ileostomy there might be a slight pressure when waste leaves your body, but understand that the intestines have no nerve endings, so there will be no unpleasant sensations. Those with a urostomy will probably be unaware of any kidney drainage.  Will you be a captive of the toilet?  Immediately post-op you will spend more time in the bathroom than you will after your body recovers from surgery. Every person is different, but on average those with an ileostomy or urostomy may empty their pouches 4 to  6 times a day; a little  less if you have a colostomy. The average wear time between pouch system changes is 3 to 5 days and the changing process should take less than 30 minutes.  Will I need to be on a special diet? Most people return to their normal diet when they have recovered from surgery. Be sure to chew your food well, eat a well-balanced diet and drink plenty of fluids. If you experience problems with a certain food, wait a couple of weeks and try it again. Will there be odor and noises? Pouching systems are designed to be odor-proof or odor-resistant. There are deodorants that can be used in the  pouch. Medications are also available to help reduce odor. Limit gas-producing foods and carbonated beverages. You will experience less gas and fewer noises as you heal from surgery. How much time will it take to care for my ostomy? At first, you may spend a lot of time learning about your ostomy and how to take care of it. As you become more comfortable and skilled at changing the pouching system, it will take very little time to care for it.  Will I be able to return to work? People with ostomies can perform most jobs. As soon as you have healed from surgery, you should be able to return to work. Heavy lifting (more than 10 pounds) may be discouraged.  What about intimacy? Sexual relationships and intimacy are important and fulfilling aspects of your life. They should continue after ostomy surgery. Intimacy-related concerns should be discussed openly between you and your partner.  Can I wear regular clothing? You do not need to wear special clothing. Ostomy pouches are fairly flat and barely noticeable. Elastic undergarments will not hurt the stoma or prevent the ostomy from functioning.  Can I participate in sports? An ostomy should not limit your involvement in sports. Many people with ostomies are runners, skiers, swimmers or participate in other active lifestyles. Talk with your caregiver first before doing heavy physical activity.  Will you starve?  Not if you follow doctor's orders at each stage of your post-op adjustment. There is no such thing as an "ostomy diet". Some people with an ostomy will be able to eat and tolerate anything; others may find diffi-culty with some foods. Each person is an individual and must determine, by trial, what is best for them. A good practice for all is to drink plenty of water.  Will you be a social outcast?  Have you met anyone who has an ostomy and is a social outcast? Why should you be the first? Only your attitude and self image will effect how you are  treated. No confi-dent person is an Occupational psychologist.   PROFESSIONAL HELP  Resources are available if you need help or have questions about your ostomy.    Specially trained nurses called Wound, Ostomy Continence Nurses (WOCN) are available for consultation in most major medical centers.   Consider getting an ostomy consult with Cena Benton at [facility] to help troubleshoot stoma pouch fittings and other issues with your ostomy: [phone]   The [employer] Ostomy Association (UOA) is a group made up of many local chapters throughout the [place]. These local groups hold meetings and provide support to prospective and existing ostomates. They sponsor educational events and have qualified visitors to make personal or telephone visits. Contact the UOA for the chapter nearest you and for other educational publications.  More detailed information can be found in Colostomy  Guide, a publication of the [employer] (UOA). Contact UOA at 1-(209)638-8906 or visit their web site at https://arellano.com/. The website contains links to other sites, suppliers and resources. Document Released: 11/17/2003 Document Revised: 02/06/2012 Document Reviewed: 03/18/2009 [facility] Patient Information 2013 [place].

## 2014-07-01 NOTE — Progress Notes (Signed)
  Radiation Oncology         [phone] ________________________________  Name: CAPERS HAGMANN MRN: [id number]  Date: 06/16/2014  DOB: 09/14/1966  End of Treatment Note  Diagnosis:   Rectal cancer     Indication for treatment:  Curative        Radiation treatment dates:   05/06/2014 through 06/16/2014  Site/dose:   The patient initially was treated to the pelvis using a 4 field 3-D conformal technique. He received 45 gray at 1.8 gray per fraction in this fashion. The patient then received a boost for an additional 5.4 gray. This consisted of a cone down 4 field technique. The patient's total dose was 50.4 gray.  Narrative: The patient tolerated radiation treatment relatively well.   He did not have major issues in terms of GI or GU toxicity. Some mild to moderate skin irritation as expected.  Plan: The patient has completed radiation treatment. The patient will return to radiation oncology clinic for routine followup in one month. I advised the patient to call or return sooner if they have any questions or concerns related to their recovery or treatment. ________________________________  Jodelle Gross, M.D., Ph.D.

## 2014-07-04 ENCOUNTER — Telehealth (INDEPENDENT_AMBULATORY_CARE_PROVIDER_SITE_OTHER): Payer: Self-pay

## 2014-07-04 NOTE — Telephone Encounter (Signed)
LMOM advising pt that his orders are in the scheduling department so he should be hearing from the scheduler soon. I asked for the pt to call back if he doesn't hear from our schedulers.

## 2014-07-04 NOTE — Telephone Encounter (Signed)
Message copied by Illene Regulus on Fri Jul 04, 2014  4:25 PM ------      Message from: Crisoforo Oxford      Created: Fri Jul 04, 2014  1:47 PM       fyi pt called to say he still has not heard from surgery scheduling. He said you told him to call by today if he had not heard anything. ty TT ------

## 2014-07-18 ENCOUNTER — Encounter: Payer: Self-pay | Admitting: Radiation Oncology

## 2014-07-21 ENCOUNTER — Encounter: Payer: Self-pay | Admitting: Radiation Oncology

## 2014-07-21 ENCOUNTER — Ambulatory Visit
Admission: RE | Admit: 2014-07-21 | Discharge: 2014-07-21 | Disposition: A | Discharge status: Home / Self Care | Payer: Commercial Managed Care - PPO | Source: Ambulatory Visit | Attending: Radiation Oncology | Admitting: Radiation Oncology

## 2014-07-21 VITALS — BP 134/88 | HR 82 | Temp 98.1°F | Resp 20 | Ht 69.0 in | Wt 185.1 lb

## 2014-07-21 DIAGNOSIS — C20 Malignant neoplasm of rectum: Secondary | ICD-10-CM

## 2014-07-21 HISTORY — DX: Personal history of irradiation: Z92.3

## 2014-07-21 NOTE — Progress Notes (Signed)
Follow up rad txs rectal ca 05/06/14-06/16/14  , no c/o pain, no discomfort when having bowel movements, no bladder problems stated either, appetite good, energy good,  Sees Dr. Benay Spice tomorrow 07/22/14, and Dr. Johney [place] surergy scheduled for 08/29/14, 8:20 AM

## 2014-07-21 NOTE — Progress Notes (Signed)
  Radiation Oncology         [phone] ________________________________  Name: Darren Hill MRN: [id number]  Date: 07/21/2014  DOB: 04-30-1966  Follow-Up Visit Note  CC: Betsy Coder, MD  Ladell Pier, MD  Diagnosis:   Rectal cancer  Interval Since Last Radiation:  Approximately one month   Narrative:  The patient returns today for routine follow-up.  The patient states that things have gone well since the end of treatment in terms of recovery. No significant ongoing  issues in terms of dysuria, rectal irritation. No blood per rectum.                         ALLERGIES:  has No Known Allergies.  Meds: Current Outpatient Prescriptions  Medication Sig Dispense Refill  . lisinopril (PRINIVIL,ZESTRIL) 10 MG tablet Take 10 mg by mouth daily.      . metroNIDAZOLE (FLAGYL) 500 MG tablet Take 1 tablet (500 mg total) by mouth as directed. Take 2 pills (=1000mg ) by mouth at 1pm, 3pm, and 10pm the day before your colorectal operation as discussed in [place] office.  Call [phone] with questions  6 tablet  0  . neomycin (MYCIFRADIN) 500 MG tablet Take 2 tablets (1,000 mg total) by mouth as directed. Take 2 pills (=1000mg ) by mouth at 1pm, 3pm, and 10pm the day before your colorectal operation as discussed in [place] office.  Call [phone] with questions  6 tablet  0   No current facility-administered medications for this encounter.    Physical Findings: The patient is in no acute distress. Patient is alert and oriented.  height is 5\' 9"  (1.753 m) and weight is 185 lb 1.6 oz (83.961 kg). His oral temperature is 98.1 F (36.7 C). His blood pressure is 134/88 and his pulse is 82. His respiration is 20. [name]   Deferred-the patient has no complaints regarding his scan which is fully healed at this time   Lab Findings: Lab Results  Component Value Date   WBC 2.5* 06/11/2014   HGB 12.9* 06/11/2014   HCT 38.8 06/11/2014   MCV 89.1 06/11/2014   PLT 242 06/11/2014     Radiographic  Findings: No results found.  Impression:    The patient is doing well approximately one month after completing preoperative chemoradiotherapy for rectal cancer. Surgery is scheduled on 08/29/2014.   Plan:  The patient will followup in 6 months.   Jodelle Gross, M.D., Ph.D.

## 2014-07-22 ENCOUNTER — Ambulatory Visit (HOSPITAL_BASED_OUTPATIENT_CLINIC_OR_DEPARTMENT_OTHER): Payer: Commercial Managed Care - PPO | Admitting: Oncology

## 2014-07-22 ENCOUNTER — Telehealth: Payer: Self-pay | Admitting: Oncology

## 2014-07-22 ENCOUNTER — Other Ambulatory Visit (HOSPITAL_BASED_OUTPATIENT_CLINIC_OR_DEPARTMENT_OTHER): Payer: Commercial Managed Care - PPO

## 2014-07-22 VITALS — BP 126/91 | HR 61 | Temp 97.6°F | Resp 20 | Ht 69.0 in | Wt 185.3 lb

## 2014-07-22 DIAGNOSIS — C20 Malignant neoplasm of rectum: Secondary | ICD-10-CM

## 2014-07-22 DIAGNOSIS — I10 Essential (primary) hypertension: Secondary | ICD-10-CM

## 2014-07-22 LAB — CBC WITH DIFFERENTIAL/PLATELET
BASO%: 1 % (ref 0.0–2.0)
Basophils Absolute: 0 10*3/uL (ref 0.0–0.1)
EOS ABS: 0.1 10*3/uL (ref 0.0–0.5)
EOS%: 3.1 % (ref 0.0–7.0)
HCT: 40 % (ref 38.4–49.9)
HGB: 13.3 g/dL (ref 13.0–17.1)
LYMPH%: 31.4 % (ref 14.0–49.0)
MCH: 29.9 pg (ref 27.2–33.4)
MCHC: 33.3 g/dL (ref 32.0–36.0)
MCV: 89.6 fL (ref 79.3–98.0)
MONO#: 0.6 10*3/uL (ref 0.1–0.9)
MONO%: 16.2 % — AB (ref 0.0–14.0)
NEUT#: 1.8 10*3/uL (ref 1.5–6.5)
NEUT%: 48.3 % (ref 39.0–75.0)
Platelets: 247 10*3/uL (ref 140–400)
RBC: 4.46 10*6/uL (ref 4.20–5.82)
RDW: 15.5 % — ABNORMAL HIGH (ref 11.0–14.6)
WBC: 3.7 10*3/uL — AB (ref 4.0–10.3)
lymph#: 1.2 10*3/uL (ref 0.9–3.3)

## 2014-07-22 NOTE — Progress Notes (Signed)
  [facility] OFFICE PROGRESS NOTE   Diagnosis: Rectal cancer  INTERVAL HISTORY:   He returns as scheduled. He completed concurrent capecitabine and radiation 06/16/2014. No mouth sores, diarrhea, or hand/foot pain. mild skin breakdown at the perineum and groin is healing. He is scheduled for surgery 08/29/2014. He feels well. He worked throughout the course of neoadjuvant therapy.  Objective:  Vital signs in last 24 hours:  Blood pressure 126/91, pulse 61, temperature 97.6 F (36.4 C), temperature source Oral, resp. rate 20, height 5\' 9"  (1.753 m), weight 185 lb 4.8 oz (84.052 kg).    HEENT: No thrush or ulcers Lymphatics: No cervical, supraclavicular, or inguinal nodes Resp: Lungs clear bilaterally Cardio: Regular in rhythm GI: No hepatomegaly, nontender Vascular: No leg edema  Skin: Resolving superficial skin breakdown at the groin bilaterally with radiation hyperpigmentation at the groin and perineum.     Lab Results:  Lab Results  Component Value Date   WBC 3.7* 07/22/2014   HGB 13.3 07/22/2014   HCT 40.0 07/22/2014   MCV 89.6 07/22/2014   PLT 247 07/22/2014   NEUTROABS 1.8 07/22/2014     Lab Results  Component Value Date   CEA 1.2 04/15/2014    Medications: I have reviewed the patient's current medications.  Assessment/Plan: 1. 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 Hypertension.  Disposition:  Mr. Rouch has completed neoadjuvant therapy. He tolerated the treatment well. He is scheduled to proceed with surgery by Dr. Johney [place] on 08/29/2014. He will return for an office visit here 09/16/2014 to discuss the surgical pathology and indication for adjuvant therapy.  Betsy Coder, MD  07/22/2014  8:37 AM

## 2014-07-22 NOTE — Telephone Encounter (Signed)
gv and printed appt sched and avs for pt fro OCT.. °

## 2014-08-07 ENCOUNTER — Encounter (HOSPITAL_COMMUNITY): Payer: Self-pay | Admitting: Pharmacy Technician

## 2014-08-22 ENCOUNTER — Other Ambulatory Visit (HOSPITAL_COMMUNITY): Payer: Self-pay | Admitting: *Deleted

## 2014-08-22 NOTE — Patient Instructions (Addendum)
20     Your procedure is scheduled on:  Friday 08/29/2014  Report to [facility] Main Entrance and follow signs to Short Stay  at  [place] AM.  Call this number if you have problems the night before or morning of surgery:  [phone]   Remember:FOLLOW BOWEL   CLEAR LIQUID DIET   Foods Allowed                                                                     Foods Excluded  Coffee and tea, regular and decaf                             liquids that you cannot  Plain Jell-O in any flavor                                             see through such as: Fruit ices (not with fruit pulp)                                     milk, soups, orange juice  Iced Popsicles                                    All solid food Carbonated beverages, regular and diet                                    Cranberry, grape and apple juices Sports drinks like Gatorade Lightly seasoned clear broth or consume(fat free) Sugar, honey syrup  Sample Menu Breakfast                                Lunch                                     Supper Cranberry juice                    Beef broth                            Chicken broth Jell-O                                     Grape juice                           Apple juice Coffee or tea                        Jell-O  Popsicle                                                Coffee or tea                        Coffee or tea  _____________________________________________________________________   PREP INSTRUCTIONS FROM DR.GROSS'S OFFICE AND FOLLOW CLEAR LIQUID DIET !          Do not eat food or drink liquids AFTER MIDNIGHT!  Take these medicines the morning of surgery with A SIP OF WATER: NONE    [facility] IS NOT RESPONSIBLE FOR ANY BELONGINGS OR VALUABLES BROUGHT TO HOSPITAL.  [name]  Leave suitcase in the car. After surgery it may be brought to your room.  For patients admitted to the hospital, checkout time is 11:00 AM the  day of              Discharge.    DO NOT WEAR  JEWELRY,MAKE-UP,LOTIONS,POWDERS,PERFUMES,CONTACTS , DENTURES OR BRIDGEWORK ,AND DO NOT WEAR FALSE EYELASHES                                    Patients discharged the day of surgery will not be allowed to drive home.  If going home the same day of surgery, must have someone stay with you  first 24 hrs.at home and arrange for someone to drive you home from the [place]: N/A   Special Instructions:              Please read over the following fact sheets that you were given:             1. [place] - Preparing for Surgery Before surgery, you can play an important role.  Because skin is not sterile, your skin needs to be as free of germs as possible.  You can reduce the number of germs on your skin by washing with CHG (chlorahexidine gluconate) soap before surgery.  CHG is an antiseptic cleaner which kills germs and bonds with the skin to continue killing germs even after washing. Please DO NOT use if you have an allergy to CHG or antibacterial soaps.  If your skin becomes reddened/irritated stop using the CHG and inform your nurse when you arrive at Short Stay. Do not shave (including legs and underarms) for at least 48 hours prior to the first CHG shower.  You may shave your face/neck. Please follow these instructions carefully:  1.  Shower with CHG Soap the night before surgery and the  morning of Surgery.  2.  If you choose to wash your hair, wash your hair first as usual with your  normal  shampoo.  3.  After you shampoo, rinse your hair and body thoroughly to remove the  shampoo.                           4.  Use CHG as you would any other liquid soap.  You can apply chg directly  to the skin and wash                       Gently with a scrungie or clean washcloth.  5.   Apply the CHG Soap to your body ONLY FROM THE NECK DOWN.   Do not use on face/ open                           Wound or open sores. Avoid contact with eyes, ears mouth and genitals (private parts).                       Wash face,  Genitals (private parts) with your normal soap.             6.  Wash thoroughly, paying special attention to the area where your surgery  will be performed.  7.  Thoroughly rinse your body with warm water from the neck down.  8.  DO NOT shower/wash with your normal soap after using and rinsing off  the CHG Soap.                9.  Pat yourself dry with a clean towel.            10.  Wear clean pajamas.            11.  Place clean sheets on your bed the night of your first shower and do not  sleep with pets. Day of Surgery : Do not apply any lotions/deodorants the morning of surgery.  Please wear clean clothes to the hospital/surgery center.  FAILURE TO FOLLOW THESE INSTRUCTIONS MAY RESULT IN THE CANCELLATION OF YOUR SURGERY PATIENT SIGNATURE_________________________________  NURSE SIGNATURE__________________________________  ________________________________________________________________________   Darren Hill  An incentive spirometer is a tool that can help keep your lungs clear and active. This tool measures how well you are filling your lungs with each breath. Taking long deep breaths may help reverse or decrease the chance of developing breathing (pulmonary) problems (especially infection) following:  A long period of time when you are unable to move or be active. BEFORE THE PROCEDURE   If the spirometer includes an indicator to show your best effort, your nurse or respiratory therapist will set it to a desired goal.  If possible, sit up straight or lean slightly forward. Try not to slouch.  Hold the incentive spirometer in an upright position. INSTRUCTIONS FOR USE  1. Sit on the edge of your bed if possible, or sit up as far as you can in bed  or on a chair. 2. Hold the incentive spirometer in an upright position. 3. Breathe out normally. 4. Place the mouthpiece in your mouth and seal your lips tightly around it. 5. Breathe in slowly and as deeply as possible, raising the piston or the ball toward the top of the column. 6. Hold your breath for 3-5 seconds or for as long as possible. Allow the piston or ball to fall to the bottom of the column. 7. Remove the mouthpiece from your mouth and  breathe out normally. 8. Rest for a few seconds and repeat Steps 1 through 7 at least 10 times every 1-2 hours when you are awake. Take your time and take a few normal breaths between deep breaths. 9. The spirometer may include an indicator to show your best effort. Use the indicator as a goal to work toward during each repetition. 10. After each set of 10 deep breaths, practice coughing to be sure your lungs are clear. If you have an incision (the cut made at the time of surgery), support your incision when coughing by placing a pillow or rolled up towels firmly against it. Once you are able to get out of bed, walk around indoors and cough well. You may stop using the incentive spirometer when instructed by your caregiver.  RISKS AND COMPLICATIONS  Take your time so you do not get dizzy or light-headed.  If you are in pain, you may need to take or ask for pain medication before doing incentive spirometry. It is harder to take a deep breath if you are having pain. AFTER USE  Rest and breathe slowly and easily.  It can be helpful to keep track of a log of your progress. Your caregiver can provide you with a simple table to help with this. If you are using the spirometer at home, follow these instructions: [place] IF:   You are having difficultly using the spirometer.  You have trouble using the spirometer as often as instructed.  Your pain medication is not giving enough relief while using the spirometer.  You develop fever of 100.5  F (38.1 C) or higher. SEEK IMMEDIATE MEDICAL CARE IF:   You cough up bloody sputum that had not been present before.  You develop fever of 102 F (38.9 C) or greater.  You develop worsening pain at or near the incision site. MAKE SURE YOU:   Understand these instructions.  Will watch your condition.  Will get help right away if you are not doing well or get worse. Document Released: 03/27/2007 Document Revised: 02/06/2012 Document Reviewed: 05/28/2007 Exit[employer] Patient Information 2014 [employer], [place].   ________________________________________________________________________  WHAT IS A BLOOD TRANSFUSION? Blood Transfusion Information  A transfusion is the replacement of blood or some of its parts. Blood is made up of multiple cells which provide different functions.  Red blood cells carry oxygen and are used for blood loss replacement.  White blood cells fight against infection.  Platelets control bleeding.  Plasma helps clot blood.  Other blood products are available for specialized needs, such as hemophilia or other clotting disorders. BEFORE THE TRANSFUSION  Who gives blood for transfusions?   Healthy volunteers who are fully evaluated to make sure their blood is safe. This is blood bank blood. Transfusion therapy is the safest it has ever been in the practice of medicine. Before blood is taken from a donor, a complete history is taken to make sure that person has no history of diseases nor engages in risky social behavior (examples are intravenous drug use or sexual activity with multiple partners). The donor's travel history is screened to minimize risk of transmitting infections, such as malaria. The donated blood is tested for signs of infectious diseases, such as HIV and hepatitis. The blood is then tested to be sure it is compatible with you in order to minimize the chance of a transfusion reaction. If you or a relative donates blood, this is often done in  anticipation of surgery and is not appropriate for  emergency situations. It takes many days to process the donated blood. RISKS AND COMPLICATIONS Although transfusion therapy is very safe and saves many lives, the main dangers of transfusion include:   Getting an infectious disease.  Developing a transfusion reaction. This is an allergic reaction to something in the blood you were given. Every precaution is taken to prevent this. The decision to have a blood transfusion has been considered carefully by your caregiver before blood is given. Blood is not given unless the benefits outweigh the risks. AFTER THE TRANSFUSION  Right after receiving a blood transfusion, you will usually feel much better and more energetic. This is especially true if your red blood cells have gotten low (anemic). The transfusion raises the level of the red blood cells which carry oxygen, and this usually causes an energy increase.  The nurse administering the transfusion will monitor you carefully for complications. HOME CARE INSTRUCTIONS  No special instructions are needed after a transfusion. You may find your energy is better. Speak with your caregiver about any limitations on activity for underlying diseases you may have. SEEK MEDICAL CARE IF:   Your condition is not improving after your transfusion.  You develop redness or irritation at the intravenous (IV) site. SEEK IMMEDIATE MEDICAL CARE IF:  Any of the following symptoms occur over the next 12 hours:  Shaking chills.  You have a temperature by mouth above 102 F (38.9 C), not controlled by medicine.  Chest, back, or muscle pain.  People around you feel you are not acting correctly or are confused.  Shortness of breath or difficulty breathing.  Dizziness and fainting.  You get a rash or develop hives.  You have a decrease in urine output.  Your urine turns a dark color or changes to pink, red, or brown. Any of the following symptoms occur over  the next 10 days:  You have a temperature by mouth above 102 F (38.9 C), not controlled by medicine.  Shortness of breath.  Weakness after normal activity.  The white part of the eye turns yellow (jaundice).  You have a decrease in the amount of urine or are urinating less often.  Your urine turns a dark color or changes to pink, red, or brown. Document Released: 11/11/2000 Document Revised: 02/06/2012 Document Reviewed: 06/30/2008 [facility] Patient Information 2014 [place], [place].  _______________________________________________________________________

## 2014-08-25 ENCOUNTER — Ambulatory Visit (HOSPITAL_COMMUNITY)
Admission: RE | Admit: 2014-08-25 | Discharge: 2014-08-25 | Disposition: A | Discharge status: Home / Self Care | Payer: Commercial Managed Care - PPO | Source: Ambulatory Visit | Attending: Anesthesiology | Admitting: Anesthesiology

## 2014-08-25 ENCOUNTER — Encounter (HOSPITAL_COMMUNITY): Payer: Self-pay

## 2014-08-25 ENCOUNTER — Encounter (HOSPITAL_COMMUNITY)
Admission: RE | Admit: 2014-08-25 | Discharge: 2014-08-25 | Disposition: A | Discharge status: Home / Self Care | Payer: Commercial Managed Care - PPO | Source: Ambulatory Visit | Attending: Surgery | Admitting: Surgery

## 2014-08-25 DIAGNOSIS — Z01818 Encounter for other preprocedural examination: Secondary | ICD-10-CM | POA: Diagnosis present

## 2014-08-25 DIAGNOSIS — Z87891 Personal history of nicotine dependence: Secondary | ICD-10-CM | POA: Insufficient documentation

## 2014-08-25 DIAGNOSIS — I10 Essential (primary) hypertension: Secondary | ICD-10-CM | POA: Insufficient documentation

## 2014-08-25 DIAGNOSIS — C189 Malignant neoplasm of colon, unspecified: Secondary | ICD-10-CM | POA: Insufficient documentation

## 2014-08-25 LAB — BASIC METABOLIC PANEL
ANION GAP: 10 (ref 5–15)
BUN: 11 mg/dL (ref 6–23)
CO2: 29 mEq/L (ref 19–32)
Calcium: 9.1 mg/dL (ref 8.4–10.5)
Chloride: 99 mEq/L (ref 96–112)
Creatinine, Ser: 1.35 mg/dL (ref 0.50–1.35)
GFR calc Af Amer: 71 mL/min — ABNORMAL LOW (ref >90)
GFR, EST NON AFRICAN AMERICAN: 62 mL/min — AB (ref >90)
Glucose, Bld: 91 mg/dL (ref 70–99)
Potassium: 4.3 mEq/L (ref 3.7–5.3)
SODIUM: 138 meq/L (ref 137–147)

## 2014-08-25 LAB — CBC
HCT: 39.6 % (ref 39.0–52.0)
HEMOGLOBIN: 13.5 g/dL (ref 13.0–17.0)
MCH: 30 pg (ref 26.0–34.0)
MCHC: 34.1 g/dL (ref 30.0–36.0)
MCV: 88 fL (ref 78.0–100.0)
Platelets: 288 10*3/uL (ref 150–400)
RBC: 4.5 MIL/uL (ref 4.22–5.81)
RDW: 13.3 % (ref 11.5–15.5)
WBC: 3.4 10*3/uL — AB (ref 4.0–10.5)

## 2014-08-25 LAB — HEMOGLOBIN A1C
HEMOGLOBIN A1C: 5.7 % — AB (ref <5.7)
Mean Plasma Glucose: 117 mg/dL — ABNORMAL HIGH (ref <117)

## 2014-08-25 NOTE — Consult Note (Signed)
WOC requested for preoperative stoma site marking  Discussed surgical procedure and stoma creation with patient.  Explained role of the [place] nurse team.  Provided the patient with educational booklet/DVD and provided samples of pouching options.  Answered patient questions.   Examined patient lying, sitting, and standing in order to place the marking in the patient's visual field, away from any creases or abdominal contour issues and within the rectus muscle.   Marked for colostomy in the LLQ  5 cm to the left of the umbilicus and 2 cm below the umbilicus.  Extensive conversation about the creation of the stoma and need for stoma.  He verbalized understanding.  Covered mark with thin film transparent dressing to preserve mark until date of surgery  WOC team will follow up with patient after surgery for continue ostomy care and teaching.  Shaquoya Cosper [place] RN,CWOCN [phone]

## 2014-08-28 MED ORDER — BUPIVACAINE 0.25 % ON-Q PUMP DUAL CATH 300 ML
300.0000 mL | INJECTION | Status: DC
  Filled 2014-08-28: qty 300

## 2014-08-28 MED ORDER — SODIUM CHLORIDE 0.9 % IV SOLN
INTRAVENOUS | Status: DC
  Filled 2014-08-28: qty 6

## 2014-08-28 NOTE — Anesthesia Preprocedure Evaluation (Addendum)
Anesthesia Evaluation  Patient identified by MRN, date of birth, ID band Patient awake    Reviewed: Allergy & Precautions, H&P , NPO status , Patient's Chart, lab work & pertinent test results  History of Anesthesia Complications Negative for: history of anesthetic complications  Airway Mallampati: II TM Distance: >3 FB Neck ROM: Full    Dental  (+) Dental Advisory Given, Missing,    Pulmonary former smoker,  breath sounds clear to auscultation  Pulmonary exam normal       Cardiovascular Exercise Tolerance: Good hypertension, Pt. on medications Rhythm:Regular Rate:Normal     Neuro/Psych negative neurological ROS  negative psych ROS   GI/Hepatic negative GI ROS, Neg liver ROS, Rectal ca s/p radiation and chemotherapy   Endo/Other  negative endocrine ROS  Renal/GU negative Renal ROS  negative genitourinary   Musculoskeletal negative musculoskeletal ROS (+)   Abdominal   Peds negative pediatric ROS (+)  Hematology negative hematology ROS (+)   Anesthesia Other Findings   Reproductive/Obstetrics negative OB ROS                         Anesthesia Physical Anesthesia Plan  ASA: III  Anesthesia Plan: General   Post-op Pain Management:    Induction: Intravenous  Airway Management Planned: Oral ETT  Additional Equipment:   Intra-op Plan:   Post-operative Plan: Extubation in OR  Informed Consent: I have reviewed the patients History and Physical, chart, labs and discussed the procedure including the risks, benefits and alternatives for the proposed anesthesia with the patient or authorized representative who has indicated his/her understanding and acceptance.   Dental advisory given  Plan Discussed with: CRNA  Anesthesia Plan Comments:         Anesthesia Quick Evaluation

## 2014-08-29 ENCOUNTER — Inpatient Hospital Stay (HOSPITAL_COMMUNITY): Payer: Commercial Managed Care - PPO | Admitting: Anesthesiology

## 2014-08-29 ENCOUNTER — Encounter (HOSPITAL_COMMUNITY): Payer: Self-pay | Admitting: *Deleted

## 2014-08-29 ENCOUNTER — Inpatient Hospital Stay (HOSPITAL_COMMUNITY)
Admission: RE | Admit: 2014-08-29 | Discharge: 2014-09-02 | DRG: 330 | Disposition: A | Discharge status: Home Health | Payer: Commercial Managed Care - PPO | Source: Ambulatory Visit | Attending: Surgery | Admitting: Surgery

## 2014-08-29 ENCOUNTER — Encounter (HOSPITAL_COMMUNITY): Payer: Commercial Managed Care - PPO | Admitting: Anesthesiology

## 2014-08-29 ENCOUNTER — Encounter (HOSPITAL_COMMUNITY)
Admission: RE | Disposition: A | Discharge status: Home Health | Payer: Self-pay | Source: Ambulatory Visit | Attending: Surgery

## 2014-08-29 DIAGNOSIS — Z87891 Personal history of nicotine dependence: Secondary | ICD-10-CM

## 2014-08-29 DIAGNOSIS — C779 Secondary and unspecified malignant neoplasm of lymph node, unspecified: Secondary | ICD-10-CM | POA: Diagnosis present

## 2014-08-29 DIAGNOSIS — I10 Essential (primary) hypertension: Secondary | ICD-10-CM | POA: Diagnosis present

## 2014-08-29 DIAGNOSIS — Z9221 Personal history of antineoplastic chemotherapy: Secondary | ICD-10-CM

## 2014-08-29 DIAGNOSIS — C772 Secondary and unspecified malignant neoplasm of intra-abdominal lymph nodes: Secondary | ICD-10-CM | POA: Diagnosis present

## 2014-08-29 DIAGNOSIS — Z923 Personal history of irradiation: Secondary | ICD-10-CM | POA: Diagnosis not present

## 2014-08-29 DIAGNOSIS — C20 Malignant neoplasm of rectum: Principal | ICD-10-CM | POA: Diagnosis present

## 2014-08-29 DIAGNOSIS — Z6827 Body mass index (BMI) 27.0-27.9, adult: Secondary | ICD-10-CM | POA: Diagnosis not present

## 2014-08-29 HISTORY — DX: Palpitations: R00.2

## 2014-08-29 HISTORY — PX: DIVERTING ILEOSTOMY: SHX5799

## 2014-08-29 HISTORY — DX: Chest pain, unspecified: R07.9

## 2014-08-29 LAB — ABO/RH: ABO/RH(D): O POS

## 2014-08-29 LAB — TYPE AND SCREEN
ABO/RH(D): O POS
ANTIBODY SCREEN: NEGATIVE

## 2014-08-29 SURGERY — ROBOT ASSISTED LAPAROSCOPIC PARTIAL COLECTOMY
Anesthesia: General

## 2014-08-29 MED ORDER — HYDROMORPHONE HCL 1 MG/ML IJ SOLN
0.2500 mg | INTRAMUSCULAR | Status: DC | PRN
  Administered 2014-08-29 (×2): 0.5 mg via INTRAVENOUS

## 2014-08-29 MED ORDER — DEXTROSE 5 % IV SOLN
2.0000 g | INTRAVENOUS | Status: AC
  Administered 2014-08-29: 2 g via INTRAVENOUS
  Filled 2014-08-29: qty 2

## 2014-08-29 MED ORDER — BUPIVACAINE 0.25 % ON-Q PUMP DUAL CATH 300 ML
INJECTION | Status: DC | PRN
  Administered 2014-08-29: 300 mL

## 2014-08-29 MED ORDER — ROCURONIUM BROMIDE 100 MG/10ML IV SOLN
INTRAVENOUS | Status: AC
  Filled 2014-08-29: qty 1

## 2014-08-29 MED ORDER — DIPHENHYDRAMINE HCL 50 MG/ML IJ SOLN: 12.5000 mg | Freq: Four times a day (QID) | INTRAMUSCULAR | Status: DC | PRN

## 2014-08-29 MED ORDER — EPHEDRINE SULFATE 50 MG/ML IJ SOLN
INTRAMUSCULAR | Status: AC
  Filled 2014-08-29: qty 1

## 2014-08-29 MED ORDER — EPHEDRINE SULFATE 50 MG/ML IJ SOLN
INTRAMUSCULAR | Status: DC | PRN
  Administered 2014-08-29 (×2): 5 mg via INTRAVENOUS

## 2014-08-29 MED ORDER — CEFOTETAN DISODIUM-DEXTROSE 2-2.08 GM-% IV SOLR
INTRAVENOUS | Status: AC
  Filled 2014-08-29: qty 50

## 2014-08-29 MED ORDER — BUPIVACAINE-EPINEPHRINE (PF) 0.25% -1:200000 IJ SOLN
INTRAMUSCULAR | Status: AC
  Filled 2014-08-29: qty 30

## 2014-08-29 MED ORDER — 0.9 % SODIUM CHLORIDE (POUR BTL) OPTIME
TOPICAL | Status: DC | PRN
  Administered 2014-08-29: 2000 mL

## 2014-08-29 MED ORDER — GLYCOPYRROLATE 0.2 MG/ML IJ SOLN
INTRAMUSCULAR | Status: DC | PRN
  Administered 2014-08-29: 0.6 mg via INTRAVENOUS

## 2014-08-29 MED ORDER — PROMETHAZINE HCL 25 MG/ML IJ SOLN: 6.2500 mg | INTRAMUSCULAR | Status: DC | PRN

## 2014-08-29 MED ORDER — ATROPINE SULFATE 0.4 MG/ML IJ SOLN
INTRAMUSCULAR | Status: AC
  Filled 2014-08-29: qty 2

## 2014-08-29 MED ORDER — HYDROMORPHONE HCL 1 MG/ML IJ SOLN
0.5000 mg | INTRAMUSCULAR | Status: DC | PRN
  Administered 2014-08-29: 1 mg via INTRAVENOUS
  Administered 2014-08-29: 0.5 mg via INTRAVENOUS
  Administered 2014-08-30 – 2014-08-31 (×9): 1 mg via INTRAVENOUS
  Filled 2014-08-29 (×11): qty 1

## 2014-08-29 MED ORDER — MENTHOL 3 MG MT LOZG
1.0000 | LOZENGE | OROMUCOSAL | Status: DC | PRN
  Filled 2014-08-29: qty 9

## 2014-08-29 MED ORDER — ALVIMOPAN 12 MG PO CAPS
12.0000 mg | ORAL_CAPSULE | Freq: Two times a day (BID) | ORAL | Status: DC
Start: 2014-08-30 — End: 2014-09-01
  Administered 2014-08-30 – 2014-09-01 (×5): 12 mg via ORAL
  Filled 2014-08-29 (×6): qty 1

## 2014-08-29 MED ORDER — PHENYLEPHRINE 40 MCG/ML (10ML) SYRINGE FOR IV PUSH (FOR BLOOD PRESSURE SUPPORT)
PREFILLED_SYRINGE | INTRAVENOUS | Status: AC
  Filled 2014-08-29: qty 10

## 2014-08-29 MED ORDER — MIDAZOLAM HCL 5 MG/5ML IJ SOLN
INTRAMUSCULAR | Status: DC | PRN
  Administered 2014-08-29: 2 mg via INTRAVENOUS

## 2014-08-29 MED ORDER — HYDROMORPHONE HCL 2 MG/ML IJ SOLN
INTRAMUSCULAR | Status: AC
  Filled 2014-08-29: qty 1

## 2014-08-29 MED ORDER — ONDANSETRON HCL 4 MG/2ML IJ SOLN
INTRAMUSCULAR | Status: DC | PRN
  Administered 2014-08-29: 4 mg via INTRAVENOUS

## 2014-08-29 MED ORDER — PHENYLEPHRINE HCL 10 MG/ML IJ SOLN
20.0000 mg | INTRAMUSCULAR | Status: DC | PRN
  Administered 2014-08-29: 25 ug/min via INTRAVENOUS

## 2014-08-29 MED ORDER — ESMOLOL HCL 10 MG/ML IV SOLN
INTRAVENOUS | Status: DC | PRN
  Administered 2014-08-29: 20 mg via INTRAVENOUS

## 2014-08-29 MED ORDER — HYDROMORPHONE HCL 1 MG/ML IJ SOLN
INTRAMUSCULAR | Status: DC | PRN
  Administered 2014-08-29 (×3): .2 mg via INTRAVENOUS
  Administered 2014-08-29: .4 mg via INTRAVENOUS
  Administered 2014-08-29: .2 mg via INTRAVENOUS
  Administered 2014-08-29 (×2): .4 mg via INTRAVENOUS

## 2014-08-29 MED ORDER — PROPOFOL 10 MG/ML IV BOLUS
INTRAVENOUS | Status: AC
  Filled 2014-08-29: qty 20

## 2014-08-29 MED ORDER — FENTANYL CITRATE 0.05 MG/ML IJ SOLN
INTRAMUSCULAR | Status: AC
  Filled 2014-08-29: qty 5

## 2014-08-29 MED ORDER — ROCURONIUM BROMIDE 100 MG/10ML IV SOLN
INTRAVENOUS | Status: AC
Start: 2014-08-29 — End: 2014-08-29
  Filled 2014-08-29: qty 1

## 2014-08-29 MED ORDER — PROPOFOL 10 MG/ML IV BOLUS
INTRAVENOUS | Status: DC | PRN
  Administered 2014-08-29: 170 mg via INTRAVENOUS

## 2014-08-29 MED ORDER — METOPROLOL TARTRATE 1 MG/ML IV SOLN
5.0000 mg | Freq: Four times a day (QID) | INTRAVENOUS | Status: DC | PRN
  Filled 2014-08-29: qty 5

## 2014-08-29 MED ORDER — HEPARIN SODIUM (PORCINE) 5000 UNIT/ML IJ SOLN
5000.0000 [IU] | Freq: Three times a day (TID) | INTRAMUSCULAR | Status: DC
  Filled 2014-08-29 (×3): qty 1

## 2014-08-29 MED ORDER — SACCHAROMYCES BOULARDII 250 MG PO CAPS
250.0000 mg | ORAL_CAPSULE | Freq: Two times a day (BID) | ORAL | Status: DC
  Administered 2014-08-29 – 2014-09-02 (×8): 250 mg via ORAL
  Filled 2014-08-29 (×10): qty 1

## 2014-08-29 MED ORDER — LIDOCAINE HCL (CARDIAC) 20 MG/ML IV SOLN
INTRAVENOUS | Status: DC | PRN
  Administered 2014-08-29: 100 mg via INTRAVENOUS

## 2014-08-29 MED ORDER — ESMOLOL HCL 10 MG/ML IV SOLN
INTRAVENOUS | Status: AC
  Filled 2014-08-29: qty 10

## 2014-08-29 MED ORDER — LIDOCAINE HCL (CARDIAC) 20 MG/ML IV SOLN
INTRAVENOUS | Status: AC
  Filled 2014-08-29: qty 5

## 2014-08-29 MED ORDER — LACTATED RINGERS IV SOLN
INTRAVENOUS | Status: DC | PRN
  Administered 2014-08-29 (×2): via INTRAVENOUS

## 2014-08-29 MED ORDER — ALUM & MAG HYDROXIDE-SIMETH 200-200-20 MG/5ML PO SUSP: 30.0000 mL | Freq: Four times a day (QID) | ORAL | Status: DC | PRN

## 2014-08-29 MED ORDER — ONDANSETRON HCL 4 MG/2ML IJ SOLN
INTRAMUSCULAR | Status: AC
  Filled 2014-08-29: qty 2

## 2014-08-29 MED ORDER — HEPARIN SODIUM (PORCINE) 5000 UNIT/ML IJ SOLN
5000.0000 [IU] | Freq: Three times a day (TID) | INTRAMUSCULAR | Status: DC
  Administered 2014-08-29 – 2014-09-02 (×11): 5000 [IU] via SUBCUTANEOUS
  Filled 2014-08-29 (×16): qty 1

## 2014-08-29 MED ORDER — LIP MEDEX EX OINT
1.0000 | TOPICAL_OINTMENT | Freq: Two times a day (BID) | CUTANEOUS | Status: DC
Start: 2014-08-29 — End: 2014-09-02
  Administered 2014-08-30 – 2014-09-02 (×6): 1 via TOPICAL
  Filled 2014-08-29 (×2): qty 7

## 2014-08-29 MED ORDER — DEXAMETHASONE SODIUM PHOSPHATE 10 MG/ML IJ SOLN
INTRAMUSCULAR | Status: AC
  Filled 2014-08-29: qty 1

## 2014-08-29 MED ORDER — PHENYLEPHRINE HCL 10 MG/ML IJ SOLN
INTRAMUSCULAR | Status: AC
  Filled 2014-08-29: qty 2

## 2014-08-29 MED ORDER — ONDANSETRON HCL 4 MG/2ML IJ SOLN: 4.0000 mg | Freq: Four times a day (QID) | INTRAMUSCULAR | Status: DC | PRN

## 2014-08-29 MED ORDER — BUPIVACAINE ON-Q PAIN PUMP (FOR ORDER SET NO CHG)
INJECTION | Status: DC
  Filled 2014-08-29: qty 1

## 2014-08-29 MED ORDER — LACTATED RINGERS IV BOLUS (SEPSIS): 1000.0000 mL | Freq: Three times a day (TID) | INTRAVENOUS | Status: AC | PRN

## 2014-08-29 MED ORDER — OXYCODONE HCL 5 MG PO TABS: 5.0000 mg | ORAL_TABLET | ORAL | Status: DC | PRN

## 2014-08-29 MED ORDER — PHENOL 1.4 % MT LIQD
2.0000 | OROMUCOSAL | Status: DC | PRN
  Filled 2014-08-29: qty 177

## 2014-08-29 MED ORDER — MIDAZOLAM HCL 2 MG/2ML IJ SOLN
INTRAMUSCULAR | Status: AC
  Filled 2014-08-29: qty 2

## 2014-08-29 MED ORDER — DEXAMETHASONE SODIUM PHOSPHATE 10 MG/ML IJ SOLN
INTRAMUSCULAR | Status: DC | PRN
  Administered 2014-08-29: 10 mg via INTRAVENOUS

## 2014-08-29 MED ORDER — MAGIC MOUTHWASH
15.0000 mL | Freq: Four times a day (QID) | ORAL | Status: DC | PRN
  Filled 2014-08-29: qty 15

## 2014-08-29 MED ORDER — BUPIVACAINE-EPINEPHRINE 0.25% -1:200000 IJ SOLN
INTRAMUSCULAR | Status: DC | PRN
  Administered 2014-08-29: 50 mL

## 2014-08-29 MED ORDER — ONDANSETRON HCL 4 MG PO TABS: 4.0000 mg | ORAL_TABLET | Freq: Four times a day (QID) | ORAL | Status: DC | PRN

## 2014-08-29 MED ORDER — ROCURONIUM BROMIDE 100 MG/10ML IV SOLN
INTRAVENOUS | Status: DC | PRN
  Administered 2014-08-29 (×5): 20 mg via INTRAVENOUS
  Administered 2014-08-29: 50 mg via INTRAVENOUS

## 2014-08-29 MED ORDER — LABETALOL HCL 5 MG/ML IV SOLN
INTRAVENOUS | Status: AC
  Filled 2014-08-29: qty 4

## 2014-08-29 MED ORDER — INFLUENZA VAC SPLIT QUAD 0.5 ML IM SUSY
0.5000 mL | PREFILLED_SYRINGE | INTRAMUSCULAR | Status: AC
  Administered 2014-09-01: 0.5 mL via INTRAMUSCULAR
  Filled 2014-08-29 (×4): qty 0.5

## 2014-08-29 MED ORDER — SODIUM CHLORIDE 0.9 % IV SOLN
INTRAVENOUS | Status: DC | PRN
  Administered 2014-08-29: 13:00:00 via INTRAPERITONEAL

## 2014-08-29 MED ORDER — LACTATED RINGERS IR SOLN
Status: DC | PRN
  Administered 2014-08-29: 1000 mL

## 2014-08-29 MED ORDER — KCL IN DEXTROSE-NACL 40-5-0.9 MEQ/L-%-% IV SOLN
INTRAVENOUS | Status: DC
  Administered 2014-08-29 – 2014-08-31 (×3): 50 mL/h via INTRAVENOUS
  Filled 2014-08-29 (×7): qty 1000

## 2014-08-29 MED ORDER — LABETALOL HCL 5 MG/ML IV SOLN
INTRAVENOUS | Status: DC | PRN
  Administered 2014-08-29: 2.5 mg via INTRAVENOUS

## 2014-08-29 MED ORDER — DIPHENHYDRAMINE HCL 12.5 MG/5ML PO ELIX
12.5000 mg | ORAL_SOLUTION | Freq: Four times a day (QID) | ORAL | Status: DC | PRN
Start: 2014-08-29 — End: 2014-09-02

## 2014-08-29 MED ORDER — HEPARIN SODIUM (PORCINE) 5000 UNIT/ML IJ SOLN
5000.0000 [IU] | Freq: Once | INTRAMUSCULAR | Status: AC
  Administered 2014-08-29: 5000 [IU] via SUBCUTANEOUS
  Filled 2014-08-29: qty 1

## 2014-08-29 MED ORDER — DEXTROSE 5 % IV SOLN
2.0000 g | Freq: Two times a day (BID) | INTRAVENOUS | Status: AC
  Administered 2014-08-29: 2 g via INTRAVENOUS
  Filled 2014-08-29 (×2): qty 2

## 2014-08-29 MED ORDER — NEOSTIGMINE METHYLSULFATE 10 MG/10ML IV SOLN
INTRAVENOUS | Status: DC | PRN
  Administered 2014-08-29: 4 mg via INTRAVENOUS

## 2014-08-29 MED ORDER — ACETAMINOPHEN 500 MG PO TABS
1000.0000 mg | ORAL_TABLET | Freq: Three times a day (TID) | ORAL | Status: DC
  Administered 2014-08-29 – 2014-09-02 (×11): 1000 mg via ORAL
  Filled 2014-08-29 (×15): qty 2

## 2014-08-29 MED ORDER — FENTANYL CITRATE 0.05 MG/ML IJ SOLN
INTRAMUSCULAR | Status: AC
Start: 2014-08-29 — End: 2014-08-29
  Filled 2014-08-29: qty 5

## 2014-08-29 MED ORDER — ALVIMOPAN 12 MG PO CAPS
12.0000 mg | ORAL_CAPSULE | Freq: Once | ORAL | Status: AC
  Administered 2014-08-29: 12 mg via ORAL
  Filled 2014-08-29: qty 1

## 2014-08-29 MED ORDER — SODIUM CHLORIDE 0.9 % IJ SOLN
INTRAMUSCULAR | Status: AC
  Filled 2014-08-29: qty 10

## 2014-08-29 MED ORDER — SUCCINYLCHOLINE CHLORIDE 20 MG/ML IJ SOLN
INTRAMUSCULAR | Status: DC | PRN
  Administered 2014-08-29: 100 mg via INTRAVENOUS

## 2014-08-29 MED ORDER — MEPERIDINE HCL 50 MG/ML IJ SOLN: 6.2500 mg | INTRAMUSCULAR | Status: DC | PRN

## 2014-08-29 MED ORDER — FENTANYL CITRATE 0.05 MG/ML IJ SOLN
INTRAMUSCULAR | Status: DC | PRN
  Administered 2014-08-29: 50 ug via INTRAVENOUS
  Administered 2014-08-29: 100 ug via INTRAVENOUS
  Administered 2014-08-29 (×4): 50 ug via INTRAVENOUS

## 2014-08-29 MED ORDER — PHENYLEPHRINE HCL 10 MG/ML IJ SOLN
INTRAMUSCULAR | Status: DC | PRN
  Administered 2014-08-29 (×2): 80 ug via INTRAVENOUS

## 2014-08-29 MED ORDER — BUPIVACAINE-EPINEPHRINE 0.25% -1:200000 IJ SOLN
INTRAMUSCULAR | Status: AC
  Filled 2014-08-29: qty 1

## 2014-08-29 MED ORDER — HYDROMORPHONE HCL 1 MG/ML IJ SOLN
INTRAMUSCULAR | Status: AC
  Filled 2014-08-29: qty 1

## 2014-08-29 SURGICAL SUPPLY — 115 items
APPLIER CLIP 5 13 M/L LIGAMAX5 (MISCELLANEOUS)
APPLIER CLIP ROT 10 11.4 M/L (STAPLE)
BLADE EXTENDED COATED 6.5IN (ELECTRODE) IMPLANT
BLADE SURG SZ11 CARB STEEL (BLADE) ×2 IMPLANT
CABLE HIGH FREQUENCY MONO STRZ (ELECTRODE) IMPLANT
CANNULA REDUC XI 12-8 STAPL (CANNULA)
CANNULA REDUCER 12-8 DVNC XI (CANNULA) IMPLANT
CATH KIT ON Q 7.5IN SLV (PAIN MANAGEMENT) IMPLANT
CATH ROBINSON RED A/P 22FR (CATHETERS) IMPLANT
CELLS DAT CNTRL 66122 CELL SVR (MISCELLANEOUS) IMPLANT
CHLORAPREP W/TINT 26ML (MISCELLANEOUS) ×2 IMPLANT
CLAMP POUCH DRAINAGE QUIET (OSTOMY) ×2 IMPLANT
CLIP APPLIE 5 13 M/L LIGAMAX5 (MISCELLANEOUS) IMPLANT
CLIP APPLIE ROT 10 11.4 M/L (STAPLE) IMPLANT
CLIP LIGATING HEM O LOK PURPLE (MISCELLANEOUS) IMPLANT
CLIP LIGATING HEMO O LOK GREEN (MISCELLANEOUS) IMPLANT
CLIP LIGATING HEMOLOK MED (MISCELLANEOUS) IMPLANT
COVER MAYO STAND STRL (DRAPES) ×2 IMPLANT
COVER TIP SHEARS 8 DVNC (MISCELLANEOUS) ×1 IMPLANT
COVER TIP SHEARS 8MM DA VINCI (MISCELLANEOUS) ×1
DECANTER SPIKE VIAL GLASS SM (MISCELLANEOUS) ×2 IMPLANT
DEVICE TROCAR PUNCTURE CLOSURE (ENDOMECHANICALS) IMPLANT
DRAIN CHANNEL 19F RND (DRAIN) IMPLANT
DRAPE ARM DVNC X/XI (DISPOSABLE) ×4 IMPLANT
DRAPE COLUMN DVNC XI (DISPOSABLE) ×1 IMPLANT
DRAPE DA VINCI XI ARM (DISPOSABLE) ×4
DRAPE DA VINCI XI COLUMN (DISPOSABLE) ×1
DRAPE SHEET LG 3/4 BI-LAMINATE (DRAPES) ×4 IMPLANT
DRAPE SURG IRRIG POUCH 19X23 (DRAPES) ×2 IMPLANT
DRAPE WARM FLUID 44X44 (DRAPE) ×2 IMPLANT
DRSG OPSITE POSTOP 4X10 (GAUZE/BANDAGES/DRESSINGS) IMPLANT
DRSG OPSITE POSTOP 4X6 (GAUZE/BANDAGES/DRESSINGS) ×2 IMPLANT
DRSG OPSITE POSTOP 4X8 (GAUZE/BANDAGES/DRESSINGS) IMPLANT
DRSG TEGADERM 2-3/8X2-3/4 SM (GAUZE/BANDAGES/DRESSINGS) ×6 IMPLANT
DRSG TEGADERM 4X4.75 (GAUZE/BANDAGES/DRESSINGS) IMPLANT
ELECT PENCIL ROCKER SW 15FT (MISCELLANEOUS) ×4 IMPLANT
ELECT REM PT RETURN 9FT ADLT (ELECTROSURGICAL) ×2
ELECTRODE REM PT RTRN 9FT ADLT (ELECTROSURGICAL) ×1 IMPLANT
ENDOLOOP SUT PDS II  0 18 (SUTURE)
ENDOLOOP SUT PDS II 0 18 (SUTURE) IMPLANT
EVACUATOR SILICONE 100CC (DRAIN) IMPLANT
GAUZE SPONGE 2X2 8PLY STRL LF (GAUZE/BANDAGES/DRESSINGS) IMPLANT
GAUZE SPONGE 4X4 12PLY STRL (GAUZE/BANDAGES/DRESSINGS) IMPLANT
GLOVE ECLIPSE 8.0 STRL XLNG CF (GLOVE) ×6 IMPLANT
GLOVE INDICATOR 8.0 STRL GRN (GLOVE) ×6 IMPLANT
GOWN STRL REUS W/TWL XL LVL3 (GOWN DISPOSABLE) ×16 IMPLANT
HOLDER FOLEY CATH W/STRAP (MISCELLANEOUS) ×2 IMPLANT
KIT BASIN OR (CUSTOM PROCEDURE TRAY) ×4 IMPLANT
KIT PROCEDURE DA VINCI SI (MISCELLANEOUS) ×1
KIT PROCEDURE DVNC SI (MISCELLANEOUS) ×1 IMPLANT
LEGGING LITHOTOMY PAIR STRL (DRAPES) ×2 IMPLANT
LUBRICANT JELLY K Y 4OZ (MISCELLANEOUS) ×2 IMPLANT
NEEDLE HYPO 22GX1.5 SAFETY (NEEDLE) ×2 IMPLANT
NEEDLE INSUFFLATION 14GA 120MM (NEEDLE) ×2 IMPLANT
PACK CARDIOVASCULAR III (CUSTOM PROCEDURE TRAY) ×2 IMPLANT
PACK GENERAL/GYN (CUSTOM PROCEDURE TRAY) ×2 IMPLANT
PEN SKIN MARKING BROAD (MISCELLANEOUS) ×2 IMPLANT
PORT LAP GEL ALEXIS MED 5-9CM (MISCELLANEOUS) ×2 IMPLANT
POUCH OSTOMY 1 PC DRNBL  2 1/2 (OSTOMY) ×1 IMPLANT
POUCH OSTOMY 2 1/2 (OSTOMY) ×1
PUMP PAIN ON-Q (MISCELLANEOUS) IMPLANT
RTRCTR WOUND ALEXIS 18CM MED (MISCELLANEOUS)
SCISSORS LAP 5X45 EPIX DISP (ENDOMECHANICALS) ×2 IMPLANT
SCRUB PCMX 4 OZ (MISCELLANEOUS) ×2 IMPLANT
SEAL CANN UNIV 5-8 DVNC XI (MISCELLANEOUS) ×4 IMPLANT
SEAL XI 5MM-8MM UNIVERSAL (MISCELLANEOUS) ×4
SEALER TISSUE G2 STRG ARTC 35C (ENDOMECHANICALS) IMPLANT
SEALER VESSEL DA VINCI XI (MISCELLANEOUS)
SEALER VESSEL EXT DVNC XI (MISCELLANEOUS) IMPLANT
SET IRRIG TUBING LAPAROSCOPIC (IRRIGATION / IRRIGATOR) ×2 IMPLANT
SLEEVE XCEL OPT CAN 5 100 (ENDOMECHANICALS) IMPLANT
SOLUTION ELECTROLUBE (MISCELLANEOUS) ×2 IMPLANT
SPONGE GAUZE 2X2 STER 10/PKG (GAUZE/BANDAGES/DRESSINGS)
SPONGE LAP 18X18 X RAY DECT (DISPOSABLE) IMPLANT
STAPLER 45 BLU RELOAD XI (STAPLE) ×2 IMPLANT
STAPLER 45 BLUE RELOAD XI (STAPLE) ×2
STAPLER 45 GREEN RELOAD XI (STAPLE)
STAPLER 45 GRN RELOAD XI (STAPLE) IMPLANT
STAPLER CIRC ILS CVD 33MM 37CM (STAPLE) ×2 IMPLANT
STAPLER SHEATH (SHEATH) ×1
STAPLER SHEATH ENDOWRIST DVNC (SHEATH) ×1 IMPLANT
SUCTION POOLE TIP (SUCTIONS) ×2 IMPLANT
SUT MNCRL AB 4-0 PS2 18 (SUTURE) ×4 IMPLANT
SUT PDS AB 1 CTX 36 (SUTURE) ×4 IMPLANT
SUT PDS AB 1 TP1 96 (SUTURE) IMPLANT
SUT PDS AB 2-0 CT2 27 (SUTURE) IMPLANT
SUT PROLENE 0 CT 2 (SUTURE) ×2 IMPLANT
SUT PROLENE 2 0 SH DA (SUTURE) IMPLANT
SUT SILK 2 0 (SUTURE) ×1
SUT SILK 2 0 SH CR/8 (SUTURE) ×2 IMPLANT
SUT SILK 2-0 18XBRD TIE 12 (SUTURE) ×1 IMPLANT
SUT SILK 3 0 (SUTURE) ×1
SUT SILK 3 0 SH CR/8 (SUTURE) ×2 IMPLANT
SUT SILK 3-0 18XBRD TIE 12 (SUTURE) ×1 IMPLANT
SUT V-LOC BARB 180 2/0GR6 GS22 (SUTURE)
SUT VIC AB 3-0 SH 18 (SUTURE) ×2 IMPLANT
SUT VIC AB 3-0 SH 27 (SUTURE)
SUT VIC AB 3-0 SH 27XBRD (SUTURE) IMPLANT
SUT VICRYL 0 UR6 27IN ABS (SUTURE) ×4 IMPLANT
SUT VLOC 180 2-0 9IN GS21 (SUTURE) IMPLANT
SUTURE V-LC BRB 180 2/0GR6GS22 (SUTURE) IMPLANT
SYR 20CC LL (SYRINGE) ×2 IMPLANT
SYRINGE 10CC LL (SYRINGE) ×2 IMPLANT
SYS LAPSCP GELPORT 120MM (MISCELLANEOUS)
SYSTEM LAPSCP GELPORT 120MM (MISCELLANEOUS) IMPLANT
TAPE UMBILICAL COTTON 1/8X30 (MISCELLANEOUS) ×2 IMPLANT
TOWEL OR 17X26 10 PK STRL BLUE (TOWEL DISPOSABLE) ×4 IMPLANT
TOWEL OR NON WOVEN STRL DISP B (DISPOSABLE) ×2 IMPLANT
TRAY FOLEY CATH 14FRSI W/METER (CATHETERS) IMPLANT
TRAY FOLEY CATH 16FRSI W/METER (SET/KITS/TRAYS/PACK) ×2 IMPLANT
TROCAR BLADELESS OPT 5 100 (ENDOMECHANICALS) ×2 IMPLANT
TUBING CONNECTING 10 (TUBING) IMPLANT
TUBING FILTER THERMOFLATOR (ELECTROSURGICAL) ×2 IMPLANT
TUNNELER SHEATH ON-Q 16GX12 DP (PAIN MANAGEMENT) IMPLANT
YANKAUER SUCT BULB TIP 10FT TU (MISCELLANEOUS) ×2 IMPLANT

## 2014-08-29 NOTE — Anesthesia Postprocedure Evaluation (Signed)
  Anesthesia Post-op Note  Patient: Darren Hill  Procedure(s) Performed: Procedure(s) (LRB): MINIMALLY INVASIVE ROBOTIC/LAPAROSCOPIC, LOW ANTERIOR RESECTION, DIVERTING LOOP ILEOSTOMY, SPLENIC FLEXURE MOBILIZATION RIGID PROCTOSCOPY (N/A)  Patient Location: PACU  Anesthesia Type: General  Level of Consciousness: awake and alert   Airway and Oxygen Therapy: Patient Spontanous Breathing  Post-op Pain: mild  Post-op Assessment: Post-op Vital signs reviewed, Patient's Cardiovascular Status Stable, Respiratory Function Stable, Patent Airway and No signs of Nausea or vomiting  Last Vitals:  Filed Vitals:   08/29/14 1400  BP: 134/82  Pulse: 106  Temp: 36.7 C  Resp: 17    Post-op Vital Signs: stable   Complications: No apparent anesthesia complications

## 2014-08-29 NOTE — H&P (Signed)
[address] [place] [address] Phone: [phone] FAX: [phone]     [name]  1966-09-16 [id number]  CARE TEAM:  PCP: Betsy Coder, MD  Outpatient Care Team: Patient Care Team: Ladell Pier, MD as PCP - General (Oncology) Ladell Pier, MD as Consulting Physician (Oncology) Carola Frost, RN as Registered Nurse Marye Round, MD as Consulting Physician (Radiation Oncology) Missy Sabins, MD as Consulting Physician (Gastroenterology) Michael Boston, MD as Consulting Physician (General Surgery) Josue Hector, MD as Consulting Physician (Cardiology)  Inpatient Treatment Team: Treatment Team: Attending Provider: Michael Boston, MD  This patient is a 48 y.o.male who presents today for surgical evaluation at the request of Dr. Benay Spice.   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   He was taken to a colonoscopy by Dr. Teena Irani on 03/31/2014. A sessile nonobstructing mass was found in the proximal rectum from 12-15 cm from the anal verge. Oozing was present. A biopsy was obtained. The area was tattooed. The exam was otherwise normal. 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 Dr. Paulita Fujita performed an endoscopic ultrasound 04/03/2012. The mass was measured at 10-15 cm from the anal verge. The mass was largely confined to the rectal wall, but an approximate 15 mm segment corresponded to the area of central ulceration where tumor was noted to penetrate through the muscularis propria. No peritumoral adenopathy. The tumor was staged as an ultrasound T3 N0 lesion. He underwent staging CTs of the chest, abdomen, and pelvis on 04/04/2014. No suspicious pulmonary nodule. The liver appeared normal. An intraluminal mass was noted in the superior rectum measuring approximately 2.1 x 2.9 cm. No evidence of perirectal or other pelvic lymphadenopathy. No  abdominal lymphadenopathy.  Surgical consultation was requested.    Afraid of any ostomy. Many questions and concerns. Really physically active. He can walk 2 miles without difficulty. No history of skin infections her MRSA. No prior abdominal surgeries. Had episode of chest pain 4 years ago with negative cardiac workup. Felt most likely to be related to heartburn.. BM 2x/day. No family history of GI/colon cancer, inflammatory bowel disease, irritable bowel syndrome, allergy such as Celiac Sprue, dietary/dairy problems, colitis, ulcers nor gastritis. No recent sick contacts/gastroenteritis. No travel outside the country. No changes in diet. No dysphagia to solids or liquids. No significant heartburn or reflux. No hematochezia, hematemesis, coffee ground emesis. No evidence of prior gastric/peptic ulceration.   He is undergone neoadjuvant chemoradiation therapy with oral Xeloda. Finished this regimen 7 days ago, 20 July. Feeling somewhat raw & sensitive back there. No bleeding. Mild discomfort with defecation. No urgency. No problems with urination. Worried about having an ostomy post-op.   Ready for surgery   Past Medical History  Diagnosis Date  . Hypertension   . Colon cancer 04/03/14    rectal mass, colon ca  . Hx of radiation therapy 05/06/14-06/16/14    rectum    Past Surgical History  Procedure Laterality Date  . Eus N/A 04/03/2014    Procedure: LOWER ENDOSCOPIC ULTRASOUND (EUS);  Surgeon: Arta Silence, MD;  Location: [facility] ENDOSCOPY;  Service: Endoscopy;  Laterality: N/A;  H/P in file cabinet ja  . Colon biopsy N/A 04/03/14    rectal mass=adenocarcinoma    History   Social History  . Marital Status: Married    Spouse Name: N/A    Number of Children: 2  . Years of Education:  N/A   Occupational History  . TRUCK DRIVER    Social History Main Topics  . Smoking status: Former Smoker -- 0.25 packs/day for 15 years    Types: Cigarettes    Quit date: 04/03/2006  . Smokeless tobacco:  Never Used  . Alcohol Use: No  . Drug Use: No  . Sexual Activity: Yes   Other Topics Concern  . Not on file   Social History Narrative  . No narrative on file    Family History  Problem Relation Age of Onset  . Brain cancer Maternal Aunt     not sure if tumor did operation on brain    Current Facility-Administered Medications  Medication Dose Route Frequency Provider Last Rate Last Dose  . bupivacaine 0.25 % ON-Q pump DUAL CATH 300 mL  300 mL Other Continuous Michael Boston, MD      . cefoTEtan (CEFOTAN) 2 g in dextrose 5 % 50 mL IVPB  2 g Intravenous On Call to OR Michael Boston, MD      . clindamycin (CLEOCIN) 900 mg, gentamicin (GARAMYCIN) 240 mg in sodium chloride 0.9 % 1,000 mL for intraperitoneal lavage   Intraperitoneal To OR Michael Boston, MD       Facility-Administered Medications Ordered in Other Encounters  Medication Dose Route Frequency Provider Last Rate Last Dose  . lactated ringers infusion    Continuous PRN Lacey A Armistead, CRNA         No Known Allergies  ROS: Constitutional:  No fevers, chills, sweats.  Weight stable Eyes:  No vision changes, No discharge HENT:  No sore throats, nasal drainage Lymph: No neck swelling, No bruising easily Pulmonary:  No cough, productive sputum CV: No orthopnea, PND   No exertional chest/neck/shoulder/arm pain. GI:  No personal nor family history of inflammatory bowel disease, irritable bowel syndrome, allergy such as Celiac Sprue, dietary/dairy problems, colitis, ulcers nor gastritis.  No recent sick contacts/gastroenteritis.  No travel outside the country.  No changes in diet. Renal: No UTIs, No hematuria Genital:  No drainage, bleeding, masses Musculoskeletal: No severe joint pain.  Good ROM major joints Skin:  No sores or lesions.  No rashes Heme/Lymph:  No easy bleeding.  No swollen lymph nodes Neuro: No focal weakness/numbness.  No seizures Psych: No suicidal ideation.  No hallucinations  BP 142/84  Pulse 85   Temp(Src) 97.4 F (36.3 C) (Oral)  Resp 18  Ht 5\' 9"  (1.753 m)  Wt 186 lb 6 oz (84.539 kg)  BMI 27.51 kg/m2  SpO2 100%  Physical Exam: General: Pt awake/alert/oriented x4 in  no major acute distress Eyes: PERRL, normal EOM. Sclera nonicteric Neuro: CN II-XII intact w/o focal sensory/motor deficits. Lymph: No head/neck/groin lymphadenopathy Psych:  No delerium/psychosis/paranoia HENT: Normocephalic, Mucus membranes moist.  No thrush Neck: Supple, No tracheal deviation Chest: No pain.  Good respiratory excursion. CV:  Pulses intact.   Regular rhythm Abdomen: Soft, Nondistended.   Nontender.  No incarcerated hernias. Ext:  SCDs BLE.  No significant edema.  No cyanosis Skin: No petechiae / purpurea.  No major sores Musculoskeletal: No severe joint pain.  Good ROM major joints   Results:   Labs: Results for orders placed during the hospital encounter of 08/29/14 (from the past 48 hour(s))  TYPE AND SCREEN     Status: None   Collection Time    08/29/14  6:25 AM      Result Value Ref Range   ABO/RH(D) O POS     Antibody Screen PENDING  Sample Expiration 09/01/2014      Imaging / Studies: Dg Chest 2 View  08/25/2014   CLINICAL DATA:  Preoperative evaluation for colon surgery, history hypertension, colon cancer, former smoker  EXAM: CHEST  2 VIEW  COMPARISON:  03/05/2011 chest radiograph, CT chest 04/04/2014  FINDINGS: Normal heart size, mediastinal contours, and pulmonary vascularity.  Lungs clear.  No pleural effusion, pneumothorax or pulmonary mass/ nodule.  Bones unremarkable.  IMPRESSION: No acute abnormalities.   Electronically Signed   By: Lavonia Dana M.D.   On: 08/25/2014 08:56    Medications / Allergies: per chart  Antibiotics: Anti-infectives   Start     Dose/Rate Route Frequency Ordered Stop   08/29/14 0607  cefoTEtan (CEFOTAN) 2 g in dextrose 5 % 50 mL IVPB     2 g 100 mL/hr over 30 Minutes Intravenous On call to O.R. 08/29/14 4166 08/30/14 0559   08/29/14 0600   clindamycin (CLEOCIN) 900 mg, gentamicin (GARAMYCIN) 240 mg in sodium chloride 0.9 % 1,000 mL for intraperitoneal lavage    Comments:  Pharmacy may adjust dosing strength, schedule, rate of infusion, etc as needed to optimize therapy    Intraperitoneal To Surgery 08/28/14 1645 08/30/14 0600      Assessment  [name]  48 y.o. male  Day of Surgery  Procedure(s): MINIMALLY INVASIVE ROBOTIC/LAPAROSCOPIC,POSSIBLE OPEN, LOW ANTERIOR RESECTION, POSSIBLE OSTOMY, RIGID PROCTOSCOPY  Problem List:  Active Problems:   * No active hospital problems. *   Assessment:   Anterior rectal cancer, status post neoadjuvant chemoradiation therapy   Plan:   I think he would benefit from low anterior resection surgery at some point. Certainly sphincter sparing approach is reasonable. He may need a diverting loop ileostomy if the anastomosis is distal to 5 cm, but because he is not a morbidly obese smoker hopefully we can avoid that. Would have to make decision intraoperatively. Might be able to do transanal extraction through the rectum with the help of the TEM system. He is still very focused on avoiding any ostomy. I again spent some time discussing with the patient in detail. Questions were answered. Multidisciplinary approach. Probably will do rigid proctoscopy with tattooing of the cancer & Foley tube rectal decompression intraoperatively:   The anatomy & physiology of the digestive tract was discussed. The pathophysiology was discussed. Natural history risks without surgery was discussed. I worked to give an overview of the disease and the frequent need to have multispecialty involvement. I feel the risks of no intervention will lead to serious problems that outweigh the operative risks; therefore, I recommended a partial proctocolectomy to remove the pathology. Minimally Invasive (Robotic/Laparoscopic) & open techniques were discussed. We will work to preserve anal & pelvic floor function without  sacrificing cure.   Risks such as bleeding, infection, abscess, leak, reoperation, possible ostomy, hernia, heart attack, death, and other risks were discussed. I noted a good likelihood this will help address the problem. Goals of post-operative recovery were discussed as well. We will work to minimize complications. An educational handout on the pathology was given as well. Questions were answered.   -VTE prophylaxis- SCDs, etc -mobilize as tolerated to help recovery    [name], M.D., F.A.C.S. Gastrointestinal and Minimally Invasive Surgery [facility] [address], [place], [place] [address] [phone] Main / Paging   08/29/2014  Note: Portions of this report may have been transcribed using voice recognition software. Every effort was made to ensure accuracy; however, inadvertent computerized  transcription errors may be present.   Any transcriptional errors that result from this process are unintentional.

## 2014-08-29 NOTE — Discharge Instructions (Signed)
Ostomy Support Information  Yes, Darren Hill heard that people get along just fine with only one of their eyes, or one of their lungs, or one of their kidneys. But you also know that you have only one intestine and only one bladder, and that leaves you feeling awfully empty, both physically and emotionally: You think no other people go around without part of their intestine with the ends of their intestines sticking out through their abdominal walls.  Well, you are wrong! There are nearly three quarters of a million people in the [place] who have an ostomy; people who have had surgery to remove all or part of their colons or bladders. There is even a national association, the [place] [employer] [place] with over 350 local affiliated support groups that are organized by volunteers who provide peer support and counseling. Darren Hill has a toll free telephone num-ber, ([phone] and an educational,  interactive website, www.ostomy.org   An ostomy is an opening in the belly (abdominal wall) made by surgery. Ostomates are people who have had this procedure. The opening (stoma) allows the kidney or bowel to discharge waste. An external pouch covers the stoma to collect waste. Pouches are are a simple bag and are odor free. Different companies have disposable or reusable pouches to fit one's lifestyle. An ostomy can either be temporary or permanent.  THERE ARE THREE MAIN TYPES OF OSTOMIES  Colostomy. A colostomy is a surgically created opening in the large intestine (colon).  Ileostomy. An ileostomy is a surgically created opening in the small intestine.  Urostomy. A urostomy is a surgically created opening to divert urine away from the bladder. FREQUENTLY ASKED QUESTIONS   Why havent you met any of these folks who have an ostomy?  Well, maybe you have! You just did not recognize them because an ostomy doesn't show. It can be kept secret if you wish. Why, maybe some of your best friends, office associates  or neighbors have an ostomy ... you never can tell.   People facing ostomy surgery have many quality-of-life questions like:  Will you bulge? Smell? Make noises? Will you feel waste leaving your body? Will you be a captive of the toilet? Will you starve? Be a social outcast? Get/stay married? Have babies? Easily bathe, go swimming, bend over?  OK, lets look at what you can expect:  Will you bulge?  Remember, without part of the intestine or bladder, and its contents, you should have a flatter tummy than before. You can expect to wear, with little exception, what you wore before surgery ... and this in-cludes tight clothing and bathing suits.  Will you smell?  Today, thanks to modern odor proof pouching systems, you can walk into an ostomy support group meeting and not smell anything that is foul or offensive. And, for those with an ileostomy or colostomy who are concerned about odor when emptying their pouch, there are in-pouch deodorants that can be used to eliminate any waste odors that may exist.  Will you make noises?  Everyone produces gas, especially if they are an air-swallower. But intestinal sounds that occur from time to time are no differ-ent than a gurgling tummy, and quite often your clothing will muffle any sounds.   Will you feel the waste discharges?  For those with a colostomy or ileostomy there might be a slight pressure when waste leaves your body, but understand that the intestines have no nerve endings, so there will be no unpleasant sensations. Those with a urostomy will  probably be unaware of any kidney drainage.  Will you be a captive of the toilet?  Immediately post-op you will spend more time in the bathroom than you will after your body recovers from surgery. Every person is different, but on average those with an ileostomy or urostomy may empty their pouches 4 to 6 times a day; a little  less if you have a colostomy. The average wear time between pouch system changes is 3  to 5 days and the changing process should take less than 30 minutes.  Will I need to be on a special diet? Most people return to their normal diet when they have recovered from surgery. Be sure to chew your food well, eat a well-balanced diet and drink plenty of fluids. If you experience problems with a certain food, wait a couple of weeks and try it again. Will there be odor and noises? Pouching systems are designed to be odor-proof or odor-resistant. There are deodorants that can be used in the pouch. Medications are also available to help reduce odor. Limit gas-producing foods and carbonated beverages. You will experience less gas and fewer noises as you heal from surgery. How much time will it take to care for my ostomy? At first, you may spend a lot of time learning about your ostomy and how to take care of it. As you become more comfortable and skilled at changing the pouching system, it will take very little time to care for it.  Will I be able to return to work? People with ostomies can perform most jobs. As soon as you have healed from surgery, you should be able to return to work. Heavy lifting (more than 10 pounds) may be discouraged.  What about intimacy? Sexual relationships and intimacy are important and fulfilling aspects of your life. They should continue after ostomy surgery. Intimacy-related concerns should be discussed openly between you and your partner.  Can I wear regular clothing? You do not need to wear special clothing. Ostomy pouches are fairly flat and barely noticeable. Elastic undergarments will not hurt the stoma or prevent the ostomy from functioning.  Can I participate in sports? An ostomy should not limit your involvement in sports. Many people with ostomies are runners, skiers, swimmers or participate in other active lifestyles. Talk with your caregiver first before doing heavy physical activity.  Will you starve?  Not if you follow doctors orders at each stage of  your post-op adjustment. There is no such thing as an ostomy diet. Some people with an ostomy will be able to eat and tolerate anything; others may find diffi-culty with some foods. Each person is an individual and must determine, by trial, what is best for them. A good practice for all is to drink plenty of water.  Will you be a social outcast?  Have you met anyone who has an ostomy and is a social outcast? Why should you be the first? Only your attitude and self image will effect how you are treated. No confi-dent person is an Occupational psychologist.   PROFESSIONAL HELP  Resources are available if you need help or have questions about your ostomy.    Specially trained nurses called Wound, Ostomy Continence Nurses (WOCN) are available for consultation in most major medical centers.   Consider getting an ostomy consult with Darren Hill at [facility] to help troubleshoot stoma pouch fittings and other issues with your ostomy: [phone]   The [place] (UOA) is a group made up of many  local chapters throughout the [place]. These local groups hold meetings and provide support to prospective and existing ostomates. They sponsor educational events and have qualified visitors to make personal or telephone visits. Contact the UOA for the chapter nearest you and for other educational publications.  More detailed information can be found in Colostomy Guide, a publication of the [employer] (UOA). Contact UOA at [phone] or visit their web site at [email or web address] The website contains links to other sites, suppliers and resources. Document Released: 11/17/2003 Document Revised: 02/06/2012 Document Reviewed: 03/18/2009 [facility] Patient Information 2013 [place].    ABDOMINAL SURGERY: POST OP INSTRUCTIONS  1. DIET: Follow a light bland diet the first 24 hours after arrival home, such as soup, liquids, crackers, etc.  Be sure to include lots of fluids  daily.  Avoid fast food or heavy meals as your are more likely to get nauseated.  Eat a low fat the next few days after surgery.   2. Take your usually prescribed home medications unless otherwise directed. 3. PAIN CONTROL: a. Pain is best controlled by a usual combination of three different methods TOGETHER: i. Ice/Heat ii. Over the counter pain medication iii. Prescription pain medication b. Most patients will experience some swelling and bruising around the incisions.  Ice packs or heating pads (30-60 minutes up to 6 times a day) will help. Use ice for the first few days to help decrease swelling and bruising, then switch to heat to help relax tight/sore spots and speed recovery.  Some people prefer to use ice alone, heat alone, alternating between ice & heat.  Experiment to what works for you.  Swelling and bruising can take several weeks to resolve.   c. It is helpful to take an over-the-counter pain medication regularly for the first few weeks.  Choose one of the following that works best for you: i. Naproxen (Aleve, etc)  Two 245m tabs twice a day ii. Ibuprofen (Advil, etc) Three 2033mtabs four times a day (every meal & bedtime) iii. Acetaminophen (Tylenol, etc) 500-65034mour times a day (every meal & bedtime) d. A  prescription for pain medication (such as oxycodone, hydrocodone, etc) should be given to you upon discharge.  Take your pain medication as prescribed.  i. If you are having problems/concerns with the prescription medicine (does not control pain, nausea, vomiting, rash, itching, etc), please call us [phone] see if we need to switch you to a different pain medicine that will work better for you and/or control your side effect better. ii. If you need a refill on your pain medication, please contact your pharmacy.  They will contact our office to request authorization. Prescriptions will not be filled after 5 pm or on week-ends. 4. Avoid getting constipated.  Between the  surgery and the pain medications, it is common to experience some constipation.  Increasing fluid intake and taking a fiber supplement (such as Metamucil, Citrucel, FiberCon, MiraLax, etc) 1-2 times a day regularly will usually help prevent this problem from occurring.  A mild laxative (prune juice, Milk of Magnesia, MiraLax, etc) should be taken according to package directions if there are no bowel movements after 48 hours.   5. Watch out for diarrhea.  If you have many loose bowel movements, simplify your diet to bland foods & liquids for a few days.  Stop any stool softeners and decrease your fiber supplement.  Switching to mild anti-diarrheal medications (Kayopectate, Pepto Bismol) can help.  If this worsens or does not  improve, please call us. 6. Wash / shower every day.  You may shower over the incision / wound.  Avoid baths until the skin is fully healed.  Continue to shower over incision(s) after the dressing is off. 7. Remove your waterproof bandages 5 days after surgery.  You may leave the incision open to air.  You may replace a dressing/Band-Aid to cover the incision for comfort if you wish. 8. ACTIVITIES as tolerated:   a. You may resume regular (light) daily activities beginning the next day--such as daily self-care, walking, climbing stairs--gradually increasing activities as tolerated.  If you can walk 30 minutes without difficulty, it is safe to try more intense activity such as jogging, treadmill, bicycling, low-impact aerobics, swimming, etc. b. Save the most intensive and strenuous activity for last such as sit-ups, heavy lifting, contact sports, etc  Refrain from any heavy lifting or straining until you are off narcotics for pain control.   c. DO NOT PUSH THROUGH PAIN.  Let pain be your guide: If it hurts to do something, don't do it.  Pain is your body warning you to avoid that activity for another week until the pain goes down. d. You may drive when you are no longer taking  prescription pain medication, you can comfortably wear a seatbelt, and you can safely maneuver your car and apply brakes. e. Dennis Bast may have sexual intercourse when it is comfortable.  9. FOLLOW UP in our office a. Please call [facility] at [phone] to set up an appointment to see your surgeon in the office for a follow-up appointment approximately 1-2 weeks after your surgery. b. Make sure that you call for this appointment the day you arrive home to insure a convenient appointment time. 10. IF YOU HAVE DISABILITY OR FAMILY LEAVE FORMS, BRING THEM TO THE OFFICE FOR PROCESSING.  DO NOT GIVE THEM TO YOUR DOCTOR.   WHEN TO CALL us [phone]: 1. Poor pain control 2. Reactions / problems with new medications (rash/itching, nausea, etc)  3. Fever over 101.5 F (38.5 C) 4. Inability to urinate 5. Nausea and/or vomiting 6. Worsening swelling or bruising 7. Continued bleeding from incision. 8. Increased pain, redness, or drainage from the incision  The clinic staff is available to answer your questions during regular business hours (8:30am-5pm).  Please dont hesitate to call and ask to speak to one of our nurses for clinical concerns.   A surgeon from [facility] is always on call at the hospitals   If you have a medical emergency, go to the nearest emergency room or call 911.    [facility], [place], [place], [place], [place]  [address] ? MAIN: [phone] ? TOLL FREE: [phone] ? FAX [phone] [email or web address]  GETTING TO GOOD BOWEL HEALTH. Irregular bowel habits such as constipation and diarrhea can lead to many problems over time.  Having one soft bowel movement a day is the most important way to prevent further problems.  The anorectal canal is designed to handle stretching and feces to safely manage our ability to get rid of solid waste (feces, poop, stool) out of our body.  BUT, hard constipated stools can act like  ripping concrete bricks and diarrhea can be a burning fire to this very sensitive area of our body, causing inflamed hemorrhoids, anal fissures, increasing risk is perirectal abscesses, abdominal pain/bloating, an making irritable bowel worse.     The goal: ONE SOFT BOWEL MOVEMENT A DAY!  To have  soft, regular bowel movements:    Drink at least 8 tall glasses of water a day.     Take plenty of fiber.  Fiber is the undigested part of plant food that passes into the colon, acting s natures broom to encourage bowel motility and movement.  Fiber can absorb and hold large amounts of water. This results in a larger, bulkier stool, which is soft and easier to pass. Work gradually over several weeks up to 6 servings a day of fiber (25g a day even more if needed) in the form of: o Vegetables -- Root (potatoes, carrots, turnips), leafy green (lettuce, salad greens, celery, spinach), or cooked high residue (cabbage, broccoli, etc) o Fruit -- Fresh (unpeeled skin & pulp), Dried (prunes, apricots, cherries, etc ),  or stewed ( applesauce)  o Whole grain breads, pasta, etc (whole wheat)  o Bran cereals    Bulking Agents -- This type of water-retaining fiber generally is easily obtained each day by one of the following:  o Psyllium bran -- The psyllium plant is remarkable because its ground seeds can retain so much water. This product is available as Metamucil, Konsyl, Effersyllium, Per Diem Fiber, or the less expensive generic preparation in drug and health food stores. Although labeled a laxative, it really is not a laxative.  o Methylcellulose -- This is another fiber derived from wood which also retains water. It is available as Citrucel. o Polyethylene Glycol - and artificial fiber commonly called Miralax or Glycolax.  It is helpful for people with gassy or bloated feelings with regular fiber o Flax Seed - a less gassy fiber than psyllium   No reading or other relaxing activity while on the toilet. If bowel  movements take longer than 5 minutes, you are too constipated   AVOID CONSTIPATION.  High fiber and water intake usually takes care of this.  Sometimes a laxative is needed to stimulate more frequent bowel movements, but    Laxatives are not a good long-term solution as it can wear the colon out. o Osmotics (Milk of Magnesia, Fleets phosphosoda, Magnesium citrate, MiraLax, GoLytely) are safer than  o Stimulants (Senokot, Castor Oil, Dulcolax, Ex Lax)    o Do not take laxatives for more than 7days in a row.    IF SEVERELY CONSTIPATED, try a Bowel Retraining Program: o Do not use laxatives.  o Eat a diet high in roughage, such as bran cereals and leafy vegetables.  o Drink six (6) ounces of prune or apricot juice each morning.  o Eat two (2) large servings of stewed fruit each day.  o Take one (1) heaping tablespoon of a psyllium-based bulking agent twice a day. Use sugar-free sweetener when possible to avoid excessive calories.  o Eat a normal breakfast.  o Set aside 15 minutes after breakfast to sit on the toilet, but do not strain to have a bowel movement.  o If you do not have a bowel movement by the third day, use an enema and repeat the above steps.    Controlling diarrhea o Switch to liquids and simpler foods for a few days to avoid stressing your intestines further. o Avoid dairy products (especially milk & ice cream) for a short time.  The intestines often can lose the ability to digest lactose when stressed. o Avoid foods that cause gassiness or bloating.  Typical foods include beans and other legumes, cabbage, broccoli, and dairy foods.  Every person has some sensitivity to other foods, so listen to our  body and avoid those foods that trigger problems for you. o Adding fiber (Citrucel, Metamucil, psyllium, Miralax) gradually can help thicken stools by absorbing excess fluid and retrain the intestines to act more normally.  Slowly increase the dose over a few weeks.  Too much fiber too  soon can backfire and cause cramping & bloating. o Probiotics (such as active yogurt, Align, etc) may help repopulate the intestines and colon with normal bacteria and calm down a sensitive digestive tract.  Most studies show it to be of mild help, though, and such products can be costly. o Medicines:   Bismuth subsalicylate (ex. Kayopectate, Pepto Bismol) every 30 minutes for up to 6 doses can help control diarrhea.  Avoid if pregnant.   Loperamide (Immodium) can slow down diarrhea.  Start with two tablets (71m total) first and then try one tablet every 6 hours.  Avoid if you are having fevers or severe pain.  If you are not better or start feeling worse, stop all medicines and call your doctor for advice o Call your doctor if you are getting worse or not better.  Sometimes further testing (cultures, endoscopy, X-ray studies, bloodwork, etc) may be needed to help diagnose and treat the cause of the diarrhea.  Managing Pain  Pain after surgery or related to activity is often due to strain/injury to muscle, tendon, nerves and/or incisions.  This pain is usually short-term and will improve in a few months.   Many people find it helpful to do the following things TOGETHER to help speed the process of healing and to get back to regular activity more quickly:  1. Avoid heavy physical activity a.  no lifting greater than 20 pounds b. Do not push through the pain.  Listen to your body and avoid positions and maneuvers than reproduce the pain c. Walking is okay as tolerated, but go slowly and stop when getting sore.  d. Remember: If it hurts to do it, then dont do it! 2. Take Anti-inflammatory medication  a. Take with food/snack around the clock for 1-2 weeks i. This helps the muscle and nerve tissues become less irritable and calm down faster b. Choose ONE of the following over-the-counter medications: i. Naproxen 2266mtabs (ex. Aleve) 1-2 pills twice a day  ii. Ibuprofen 20080mabs (ex. Advil,  Motrin) 3-4 pills with every meal and just before bedtime iii. Acetaminophen 500m19mbs (Tylenol) 1-2 pills with every meal and just before bedtime 3. Use a Heating pad or Ice/Cold Pack a. 4-6 times a day b. May use warm bath/hottub  or showers 4. Try Gentle Massage and/or Stretching  a. at the area of pain many times a day b. stop if you feel pain - do not overdo it  Try these steps together to help you body heal faster and avoid making things get worse.  Doing just one of these things may not be enough.    If you are not getting better after two weeks or are noticing you are getting worse, contact our office for further advice; we may need to re-evaluate you & see what other things we can do to help.  Colorectal Cancer Colorectal cancer is an abnormal growth of tissue (tumor) in the colon or rectum that is cancerous (malignant). Unlike noncancerous (benign) tumors, malignant tumors can spread to other parts of your body. The colon is the large bowel or large intestine. The rectum is the last several inches of the colon.  RISK FACTORS The exact cause of colorectal  cancer is unknown. However, the following factors may increase your chances of getting colorectal cancer:   Age older than 42 years.   Abnormal growths (polyps) on the inner wall of the colon or rectum.   Diabetes.   African American race.   Family history of hereditary nonpolyposis colorectal cancer. This condition is caused by changes in the genes that are responsible for repairing mismatched DNA.   Personal history of cancer. A person who has already had colorectal cancer may develop it a second time. Also, women with a history of ovarian, uterine, or breast cancer are at a somewhat higher risk of developing colorectal cancer.  Certain hereditary conditions.  Eating a diet that is high in fat (especially animal fat) and low in fiber, fruits, and vegetables.  Sedentary lifestyle.  Inflammatory bowel disease,  including ulcerative colitis and Crohn's disease.   Smoking.   Excessive alcohol use.  SYMPTOMS Early colorectal cancer often does not cause symptoms. As the cancer grows, symptoms may include:   Changes in bowel habits.  Diarrhea.   Constipation.   Feeling like the bowel does not empty completely after a bowel movement.   Blood in the stool.   Stools that are narrower than usual.   Abdominal discomfort, pain, bloating, fullness, or cramps.  Frequent gas pain.   Unexplained weight loss.   Constant tiredness.   Nausea and vomiting.  DIAGNOSIS  Your health care provider will ask about your medical history. He or she may also perform a number of procedures, such as:   A physical exam.  A digital rectal exam.  A fecal occult blood test.  A barium enema.  Blood tests.   X-rays.   Imaging tests, such as CT scans or MRIs.   Taking a tissue sample (biopsy) from your colon or rectum to look for cancer cells.   A sigmoidoscopy to view the inside of the last part of your colon.   A colonoscopy to view the inside of your entire colon.   An endorectal ultrasound to see how deep a rectal tumor has grown and whether the cancer has spread to lymph nodes or other nearby tissues.  Your cancer will be staged to determine its severity and extent. Staging is a careful attempt to find out the size of the tumor, whether the cancer has spread, and if so, to what parts of the body. You may need to have more tests to determine the stage of your cancer. The test results will help determine what treatment plan is best for you.   Stage 0. The cancer is found only in the innermost lining of the colon or rectum.   Stage I. The cancer has grown into the inner wall of the colon or rectum. The cancer has not yet reached the outer wall of the colon.   Stage II. The cancer extends more deeply into or through the wall of the colon or rectum. It may have invaded nearby  tissue, but cancer cells have not spread to the lymph nodes.   Stage III. The cancer has spread to nearby lymph nodes but not to other parts of the body.   Stage IV. The cancer has spread to other parts of the body, such as the liver or lungs.  Your health care provider may tell you the detailed stage of your cancer, which includes both a number and a letter.  TREATMENT  Depending on the type and stage, colorectal cancer may be treated with surgery, radiation therapy, chemotherapy,  targeted therapy, or radiofrequency ablation. Some people have a combination of these therapies. Surgery may be done to remove the polyps from your colon. In early stages, your health care provider may be able to do this during a colonoscopy. In later stages, surgery may be done to remove part of your colon.  HOME CARE INSTRUCTIONS   Take medicines only as directed by your health care provider.   Maintain a healthy diet.   Consider joining a support group. This may help you learn to cope with the stress of having colorectal cancer.   Seek advice to help you manage treatment of side effects.   Keep all follow-up visits as directed by your health care provider.   Inform your cancer specialist if you are admitted to the hospital.  SEEK MEDICAL CARE IF:  Your diarrhea or constipation does not go away.   Your bowel habits change.  You have increased abdominal pain.   You notice new fatigue or weakness.  You lose weight. Document Released: 11/14/2005 Document Revised: 03/31/2014 Document Reviewed: 05/09/2013 [facility] Patient Information 2015 [place], [place]. This information is not intended to replace advice given to you by your health care provider. Make sure you discuss any questions you have with your health care provider.

## 2014-08-29 NOTE — Op Note (Signed)
08/29/2014  1:51 PM  PATIENT:  Darren Hill  48 y.o. male  Patient Care Team: Ladell Pier, MD as PCP - General (Oncology) Ladell Pier, MD as Consulting Physician (Oncology) Marye Round, MD as Consulting Physician (Radiation Oncology) Missy Sabins, MD as Consulting Physician (Gastroenterology) Michael Boston, MD as Consulting Physician (General Surgery) Josue Hector, MD as Consulting Physician (Cardiology)  PRE-OPERATIVE DIAGNOSIS:  MID RECTAL CANCER  POST-OPERATIVE DIAGNOSIS:  MID RECTAL CANCER  PROCEDURE:  Procedure(s): MINIMALLY INVASIVE ROBOTIC LOW ANTERIOR RESECTION DIVERTING LOOP ILEOSTOMY SPLENIC FLEXURE MOBILIZATION RIGID PROCTOSCOPY  SURGEON:  Surgeon(s): Michael Boston, MD Leighton Ruff, MD - Assist  ANESTHESIA:   local and general  EBL:  Total I/O In: 4000 [I.V.:4000] Out: 270 [Urine:120; Blood:150]  Delay start of Pharmacological VTE agent (>24hrs) due to surgical blood loss or risk of bleeding:  no  DRAINS: 19 Fr Blake drain in the pelvis   SPECIMEN:  Source of Specimen:  Rectosigmoid colon with distal anastomotic ring margin  DISPOSITION OF SPECIMEN:  PATHOLOGY  COUNTS:  YES  PLAN OF CARE: Admit to inpatient   PATIENT DISPOSITION:  PACU - hemodynamically stable.  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  I recommended segmental resection:  The anatomy & physiology of the digestive tract was discussed.  The pathophysiology was discussed.  Natural history risks without surgery was discussed.   I worked to give an overview of the disease and the frequent need to have multispecialty involvement.  I feel the risks of no intervention will lead to serious problems that outweigh the operative risks; therefore, I recommended a partial colectomy to remove the pathology.  Laparoscopic & open techniques were discussed.   Risks such as bleeding, infection, abscess, leak, reoperation, possible  ostomy, hernia, heart attack, death, and other risks were discussed.  I noted a good likelihood this will help address the problem.   Goals of post-operative recovery were discussed as well.  We will work to minimize complications.  Educational materials on the pathology had been given in the office.  Questions were answered.    The patient expressed understanding & wished to proceed with surgery.  OR FINDINGS:   Patient had Bulky tumor starting at the distal peritoneal reflection the mid rectum.  No obvious metastatic disease on visceral parietal peritoneum or liver.  The anastomosis rests 4-5 cm from the anal verge by rigid proctoscopy.  DESCRIPTION:   Informed consent was confirmed.  The patient underwent general anaesthesia without difficulty.  The patient was positioned appropriately.  VTE prevention in place.  The patient's abdomen was clipped, prepped, & draped in a sterile fashion.  Surgical timeout confirmed our plan.  The patient was positioned in reverse Trendelenburg.  Abdominal entry was gained using optical entry technique in the right upper abdomen.  Entry was clean.  I induced carbon dioxide insufflation.  Camera inspection revealed no injury.  Extra ports were carefully placed under direct laparoscopic visualization.  The patient was carefully placed in Trendelenburg positioning.  I reflected the greater omentum and the upper abdomen the small bowel in the upper abdomen.  XI daVinci Intuitive robot docked.  I scored the base of peritoneum of the right side of the mesentery of the left colon from the ligament of Treitz to the peritoneal reflection of the mid rectum.  The patient had He is tattooing and bulky tumor at the low peritoneal reflection at the mid rectum.  I  elevated the sigmoid mesentery and entered into the retro-mesenteric plane. We were able to identify the left ureter and gonadal vessels. We kept those posterior within the retroperitoneum and elevated the left colon  mesentery off that. I did isolated IMA pedicle but did not ligate it yet.  I continued distally and got into the avascular plane posterior to the mesorectum. This allowed me to help mobilize the rectum as well by freeing the mesorectum off the sacrum.  I mobilized the peritoneal coverings towards the peritoneal reflection on both the right and left sides of the rectum.  I could see the right and left ureters and stayed away from them.    Proceeded with mesorectal excision.  Elevated the rectum anteriorly and freed the mesorectum off the presacral space down to the level of the coccyx.  Came around laterally.  Gradually worked on both sides and anteriorly until I got into the distal rectum, 5 cm from the distal aspect of the mass.  And a digital rectal examination to confirm that we had dissected well distal to the stricture mass in the mid rectum.  I skeletonized the mesial rectum in the distal rectum.  I had taken some left obturator thickened fat for good margins.  And up stapling at the distal rectum using the robotic stapler 45 mm blue load x2 fires.  I skeletonized the lymph nodes off the inferior mesenteric artery pedicle.  I went down to its takeoff from the aorta.  I isolated the inferior mesenteric vein off of the ligament of Treitz just cephalad to that as well.  After confirming the left ureter was out of the way, I went ahead and ligated the inferior mesenteric artery pedicle with bipolar EnSeal just near its takeoff from the aorta.  I did ligate the inferior mesenteric vein in a similar fashion.  We ensured hemostasis.   I continued to elevate the left colon off the retroperitoneum up towards the splenic flexure.   I mobilized the left colon in a lateral to medial fashion off the line of Toldt up towards the splenic flexure.  And then elevated the greater omentum off the mid transverse colon towards the splenic flexure distally.  It was some what bulky and adherent with adhesions on the right  side.  Dentures able to roll the splenic flexure medially and freed off the retroperitoneal attachments to the inferior pancreatic ridge.  With that I got good splenic flexure mobilization.  The splenic flexure could reach down to the pelvic brim.    I placed a wound protector through a 6 cm Pfannenstiel incision in the suprapubic region, taking care to avoid bladder injury.  I was able to eviscerate the rectosigmoid and descending colon out the wound.  I chose a region at the proximal descending colon that was soft and easily reached down. I clamped the colon proximal to this area using a soft bowel clamp. I transected at the descending/sigmoid junction with a scalpel. I got healthy bleeding mucosa. I transected the remaining specimen mesentery in a radial fashion to preserve good blood supply to the proximal colon end.  We sent the rectosigmoid colon specimen off to go to pathology.  We sized the colon orifice.  I chose a 33 EEA anvil stapler system. I placed the anvil to the open end of the descending colon and closed around it using a 0 Prolene pursestring.  We did copious irrigation with crystalloid solution.  Hemostasis was good.  The distal end of the colon at  the handle easily reached down to the rectal stump.  Dr Marcello Moores  scrubbed down and did gentle anal dilation and advanced the EEA stapler up the rectal stump. The spike was brought out at the provimal end of the rectal stump under direct visualization.  I attached the anvil of the proximal colon the spike of the stapler. Anvil was tightened down and held clamped for 60 seconds. The EEA stapler was fired and held clamped for 30 seconds. The stapler was released & removed. We noted 2 excellent anastomotic rings. Blue stitch is in the proximal ring.  Dr Marcello Moores did rigid proctoscopy noted the anastomosis was at 4-5 cm from the anal verge consistent with the distal rectum.  We did a final irrigation of antibiotic solution (900 mg clindamycin/240 mg  gentamicin in a liter of crystalloid) & held that for the pelvic air leak test .  The rectum was insufflated the rectum while clamping the colon proximal to that anastomosis.  There was a negative air leak test. There was no tension of mesentery or bowel at the anastomosis.   Tissues looked viable.    Because the anastomosis was within 5 cm of the anal verge and he had radiation and male body habitus, we felt it would be safest to protect the anastomosis with a diverting loop ileostomy.  I found the ileocecal region.  I ran the small bowel more proximally to the ligament of Treitz to get small bowel out from under the left colon.  Saw no evidence of serosal injury or other abnormalities.  I located an area in the distal ileum about 9 inches more proximally than easily stretched to the premarked right paramedian periumbilical ileostomy site.  I made a window in the mesentery and placed a Robinson catheter through the window to help hold that as a sling to pull the loop ileostomy through the abdominal wall.  Placed a drain through the left upper quadrant port side down towards the pelvis.  We changed gown and gloves.  The patient was re-draped.  Sterile unused instruments were used from this point out per colon SSI prevention protocol.   I placed On-Q catheter and sheaths into the preperitoneal space under direct palpation.  I removed CO2 gas out through the ports.  I excised the skin of the 12 mm stapler port site in the right periumbilical/paramedian region.  Split the fascia primarily transversely and brought up the loop of ileum up through the wound for the loop ileostomy.  We held it in place with a Babcock but did not open it.  I closed the 66mm port sites using Monocryl stitch and sterile dressing.  I closed the Pfannenstiel wound using a 0 Vicryl vertical peritoneal closure and a #1 PDS transverse anterior rectal fascial closure. I closed the skin with some interrupted Monocryl stitches. I placed  antibiotic-soaked wicks into the closure at the corners & centrally x4 between those areas. I placed a sterile dressing.  OnQ catheters placed & sheaths peeled away.  I matured the loop ileostomy using 3-0 Vicryl interrupted sutures to good result.  Placed compliance.  The patient was extubated.  In the recovery room stable.  I discussed postop findings with the patient's wife.  Expected postoperative goals of recovery were discussed.  I explained the need for the diverting loop ileostomy and plans for home health ostomy care upon discharge in addition to training in the hospital.  Questions were answered.  She expressed understanding and appreciation.    Revonda Standard  Johney [place], M.D., F.A.C.S. Gastrointestinal and Minimally Invasive Surgery [facility] [address], [place], [place] [address] [phone] Main / Paging

## 2014-08-29 NOTE — Transfer of Care (Signed)
Immediate Anesthesia Transfer of Care Note  Patient: Darren Hill  Procedure(s) Performed: Procedure(s): MINIMALLY INVASIVE ROBOTIC/LAPAROSCOPIC, LOW ANTERIOR RESECTION, DIVERTING LOOP ILEOSTOMY, SPLENIC FLEXURE MOBILIZATION RIGID PROCTOSCOPY (N/A)  Patient Location: PACU  Anesthesia Type:General  Level of Consciousness: awake, alert , oriented and patient cooperative  Airway & Oxygen Therapy: Patient Spontanous Breathing and Patient connected to face mask oxygen  Post-op Assessment: Report given to PACU RN, Post -op Vital signs reviewed and stable and Patient moving all extremities  Post vital signs: Reviewed and stable  Complications: No apparent anesthesia complications

## 2014-08-30 LAB — BASIC METABOLIC PANEL WITH GFR
Anion gap: 8 (ref 5–15)
BUN: 13 mg/dL (ref 6–23)
CO2: 28 meq/L (ref 19–32)
Calcium: 8.6 mg/dL (ref 8.4–10.5)
Chloride: 101 meq/L (ref 96–112)
Creatinine, Ser: 1.4 mg/dL — ABNORMAL HIGH (ref 0.50–1.35)
GFR calc Af Amer: 68 mL/min — ABNORMAL LOW
GFR calc non Af Amer: 59 mL/min — ABNORMAL LOW
Glucose, Bld: 112 mg/dL — ABNORMAL HIGH (ref 70–99)
Potassium: 4.1 meq/L (ref 3.7–5.3)
Sodium: 137 meq/L (ref 137–147)

## 2014-08-30 LAB — MAGNESIUM: Magnesium: 2 mg/dL (ref 1.5–2.5)

## 2014-08-30 LAB — CBC
HCT: 33.4 % — ABNORMAL LOW (ref 39.0–52.0)
Hemoglobin: 11.7 g/dL — ABNORMAL LOW (ref 13.0–17.0)
MCH: 30.4 pg (ref 26.0–34.0)
MCHC: 35 g/dL (ref 30.0–36.0)
MCV: 86.8 fL (ref 78.0–100.0)
Platelets: 244 K/uL (ref 150–400)
RBC: 3.85 MIL/uL — ABNORMAL LOW (ref 4.22–5.81)
RDW: 13 % (ref 11.5–15.5)
WBC: 9.3 K/uL (ref 4.0–10.5)

## 2014-08-30 NOTE — Progress Notes (Signed)
Patient ID: Darren Hill, male   DOB: 1966-05-07, 48 y.o.   MRN: [id number]  [facility] - Progress Note  POD# 1  Subjective: Patient comfortable.  Mild pain.  No nausea or emesis.  Objective: Vital signs in last 24 hours: Temp:  [97.6 F (36.4 C)-99.3 F (37.4 C)] 98.2 F (36.8 C) (10/03 0546) Pulse Rate:  [88-117] 90 (10/03 0546) Resp:  [16-19] 18 (10/03 0546) BP: (124-147)/(75-87) 124/75 mmHg (10/03 0546) SpO2:  [95 %-100 %] 95 % (10/03 0546) Weight:  [192 lb 3.9 oz (87.2 kg)] 192 lb 3.9 oz (87.2 kg) (10/03 0546) Last BM Date: 08/29/14  Intake/Output from previous day: 10/02 0701 - 10/03 0700 In: 5695 [I.V.:5645; IV Piggyback:50] Out: 5465 [Urine:3720; Drains:430; Stool:50; Blood:150]  Exam: HEENT - clear, not icteric Neck - soft Chest - clear bilaterally Cor - RRR, no murmur Abd - soft with mild distension; no BS present; ostomy viable; dressings intact; JP with small serosanguinous Ext - no significant edema Neuro - grossly intact, no focal deficits  Lab Results:   Recent Labs  08/30/14 0530  WBC 9.3  HGB 11.7*  HCT 33.4*  PLT 244     Recent Labs  08/30/14 0530  NA 137  K 4.1  CL 101  CO2 28  GLUCOSE 112*  BUN 13  CREATININE 1.40*  CALCIUM 8.6    Studies/Results: No results found.  Assessment / Plan: 1.  Status post LAR with diverting ileostomy  Clear liquid diet  OOB, ambulate  Earnstine Regal, MD, [facility] Office: [phone]  08/30/2014

## 2014-08-31 NOTE — Progress Notes (Signed)
CARE MANAGEMENT NOTE 08/31/2014  Patient:  JAQUANN, GUARISCO   Account Number:  [id number]  Date Initiated:  08/31/2014  Documentation initiated by:  [facility]  Subjective/Objective Assessment:   DIVERTING LOOP ILEOSTOMY     Action/Plan:   ostomy training at home   Anticipated DC Date:     Anticipated DC Plan:  [place]  CM consult      [place]   Choice offered to / List presented to:  C-3 Spouse        HH arranged  HH-1 RN      [place]   Status of service:  In process, will continue to follow Medicare Important Message given?   (If response is "NO", the following Medicare IM given date fields will be blank) Date Medicare IM given:   Medicare IM given by:   Date Additional Medicare IM given:   Additional Medicare IM given by:    Discharge Disposition:    Per UR Regulation:    If discussed at Long Length of Stay Meetings, dates discussed:    Comments:  08/31/2014 1830 NCM spoke to pt and offered choice for [facility]. Wife at home to assist pt. Gave permission to speak to wife. Notified [facility] of referral. Jonnie Finner RN CCM Case Mgmt phone [phone]

## 2014-08-31 NOTE — Progress Notes (Signed)
2 Days Post-Op  Subjective: No complaints other than mild left back pain Passed some flatus  Objective: Vital signs in last 24 hours: Temp:  [98 F (36.7 C)-98.6 F (37 C)] 98.4 F (36.9 C) (10/04 0521) Pulse Rate:  [73-90] 90 (10/04 0521) Resp:  [17-18] 17 (10/04 0521) BP: (128-162)/(61-94) 128/61 mmHg (10/04 0521) SpO2:  [93 %-100 %] 99 % (10/04 0521) Weight:  [180 lb 1.6 oz (81.693 kg)] 180 lb 1.6 oz (81.693 kg) (10/04 0741) Last BM Date: 08/29/14  Intake/Output from previous day: 10/03 0701 - 10/04 0700 In: 1560 [P.O.:360; I.V.:1200] Out: 3350 [Urine:2650; Drains:320; Stool:380] Intake/Output this shift:    Abdomen soft, ostomy pink with small amount of GI contents in bag  Lab Results:   Recent Labs  08/30/14 0530  WBC 9.3  HGB 11.7*  HCT 33.4*  PLT 244   BMET  Recent Labs  08/30/14 0530  NA 137  K 4.1  CL 101  CO2 28  GLUCOSE 112*  BUN 13  CREATININE 1.40*  CALCIUM 8.6   PT/INR No results found for this basename: LABPROT, INR,  in the last 72 hours ABG No results found for this basename: PHART, PCO2, PO2, HCO3,  in the last 72 hours  Studies/Results: No results found.  Anti-infectives: Anti-infectives   Start     Dose/Rate Route Frequency Ordered Stop   08/29/14 2000  cefoTEtan (CEFOTAN) 2 g in dextrose 5 % 50 mL IVPB     2 g 100 mL/hr over 30 Minutes Intravenous Every 12 hours 08/29/14 1602 08/29/14 2050   08/29/14 1255  clindamycin (CLEOCIN) 900 mg, gentamicin (GARAMYCIN) 240 mg in sodium chloride 0.9 % 1,000 mL for intraperitoneal lavage  Status:  Discontinued       As needed 08/29/14 1255 08/29/14 1350   08/29/14 0607  cefoTEtan (CEFOTAN) 2 g in dextrose 5 % 50 mL IVPB     2 g 100 mL/hr over 30 Minutes Intravenous On call to O.R. 08/29/14 9030 08/29/14 0747   08/29/14 0600  clindamycin (CLEOCIN) 900 mg, gentamicin (GARAMYCIN) 240 mg in sodium chloride 0.9 % 1,000 mL for intraperitoneal lavage  Status:  Discontinued    Comments:  Pharmacy  may adjust dosing strength, schedule, rate of infusion, etc as needed to optimize therapy    Intraperitoneal To Surgery 08/28/14 1645 08/29/14 1531      Assessment/Plan: s/p Procedure(s): MINIMALLY INVASIVE ROBOTIC/LAPAROSCOPIC, LOW ANTERIOR RESECTION, DIVERTING LOOP ILEOSTOMY, SPLENIC FLEXURE MOBILIZATION RIGID PROCTOSCOPY (N/A)  Doing well Continue protocol Ambulate Foley out  LOS: 2 days    Darren Hill A 08/31/2014

## 2014-09-01 MED ORDER — LACTATED RINGERS IV BOLUS (SEPSIS): 1000.0000 mL | Freq: Three times a day (TID) | INTRAVENOUS | Status: DC | PRN

## 2014-09-01 MED ORDER — SODIUM CHLORIDE 0.9 % IJ SOLN
3.0000 mL | Freq: Two times a day (BID) | INTRAMUSCULAR | Status: DC
  Administered 2014-09-01: 3 mL via INTRAVENOUS

## 2014-09-01 MED ORDER — TAB-A-VITE/IRON PO TABS
1.0000 | ORAL_TABLET | Freq: Every day | ORAL | Status: DC
  Administered 2014-09-01 – 2014-09-02 (×2): 1 via ORAL
  Filled 2014-09-01 (×2): qty 1

## 2014-09-01 MED ORDER — LOPERAMIDE HCL 2 MG PO CAPS
2.0000 mg | ORAL_CAPSULE | Freq: Every day | ORAL | Status: DC
Start: 2014-09-01 — End: 2014-09-02
  Administered 2014-09-01: 2 mg via ORAL
  Filled 2014-09-01 (×2): qty 1

## 2014-09-01 MED ORDER — SODIUM CHLORIDE 0.9 % IJ SOLN: 3.0000 mL | INTRAMUSCULAR | Status: DC | PRN

## 2014-09-01 MED ORDER — LOPERAMIDE HCL 2 MG PO CAPS: 2.0000 mg | ORAL_CAPSULE | Freq: Three times a day (TID) | ORAL | Status: DC | PRN

## 2014-09-01 MED ORDER — SODIUM CHLORIDE 0.9 % IV SOLN: 250.0000 mL | INTRAVENOUS | Status: DC | PRN

## 2014-09-01 MED ORDER — OXYCODONE HCL 5 MG PO TABS
5.0000 mg | ORAL_TABLET | ORAL | Status: DC | PRN
  Administered 2014-09-01 (×2): 5 mg via ORAL
  Filled 2014-09-01 (×3): qty 1

## 2014-09-01 NOTE — Progress Notes (Addendum)
[facility]  [address] [place], [place] [address] Phone: [phone] FAX: [phone]    Darren Hill [id number] Dec 26, 1965  CARE TEAM:  PCP: Betsy Coder, MD  Outpatient Care Team: Patient Care Team: Ladell Pier, MD as PCP - General (Oncology) Ladell Pier, MD as Consulting Physician (Oncology) Marye Round, MD as Consulting Physician (Radiation Oncology) Missy Sabins, MD as Consulting Physician (Gastroenterology) Michael Boston, MD as Consulting Physician (General Surgery) Josue Hector, MD as Consulting Physician (Cardiology)  Inpatient Treatment Team: Treatment Team: Attending Provider: Michael Boston, MD; Registered Nurse: Kristopher Oppenheim, RN   Subjective:  Feeling better.  Minimal pain.  Walking the hallways.  Wife in room.  Having flatus in bag.  Come to terms with it fine.  One small BM out per rectum.  Objective:  Vital signs:  Filed Vitals:   08/31/14 0741 08/31/14 1331 08/31/14 2120 09/01/14 0530  BP:  127/89 142/78 140/82  Pulse:  92 78 91  Temp:  97.9 F (36.6 C) 99.1 F (37.3 C) 98.8 F (37.1 C)  TempSrc:  Oral Oral Oral  Resp:  18 18 16   Height:      Weight: 180 lb 1.6 oz (81.693 kg)   186 lb 1.1 oz (84.4 kg)  SpO2:  100% 100% 99%    Last BM Date: 08/31/14  Intake/Output   Yesterday:  10/04 0701 - 10/05 0700 In: 1680 [P.O.:480; I.V.:1200] Out: 3835 [Urine:2950; Drains:185; Stool:700] This shift:  Total I/O In: -  Out: 300 [Urine:200; Drains:20; Stool:80]  Bowel function:  Flatus: y  BM: thinly bilious  Drain: serosanguinous  Physical Exam:  General: Pt awake/alert/oriented x4 in no acute distress Eyes: PERRL, normal EOM.  Sclera clear.  No icterus Neuro: CN II-XII intact w/o focal sensory/motor deficits. Lymph: No head/neck/groin lymphadenopathy Psych:  No delerium/psychosis/paranoia HENT: Normocephalic, Mucus membranes moist.  No thrush Neck: Supple, No  tracheal deviation Chest: No chest wall pain w good excursion CV:  Pulses intact.  Regular rhythm MS: Normal AROM mjr joints.  No obvious deformity Abdomen: Soft.  Nondistended.  Mildly tender at incisions only.  No evidence of peritonitis.  No incarcerated hernias.  Loop ileostomy pink.  ## gas and some liquid succus in bag Ext:  SCDs BLE.  No mjr edema.  No cyanosis Skin: No petechiae / purpura   Problem List:   Principal Problem:   Rectal cancer   Assessment  Darren Hill  48 y.o. male  3 Days Post-Op  Procedure(s): MINIMALLY INVASIVE ROBOTIC/LAPAROSCOPIC, LOW ANTERIOR RESECTION, DIVERTING LOOP ILEOSTOMY, SPLENIC FLEXURE MOBILIZATION RIGID PROCTOSCOPY  Recovering well so far  Plan:  Solid diet.  Stop IV fluids.  Continue ostomy care training.  Antidiarrheal regimen.  -VTE prophylaxis- SCDs, etc  -mobilize as tolerated to help recovery  D/C patient from hospital when patient meets criteria (anticipate in 1-2 day(s)):  Tolerating oral intake well Ambulating in walkways Adequate pain control without IV medications Urinating  Having flatus  I updated the patient's status to the patient & his wife.  Intraoperative findings and reasoning for diverting loop ileostomy discussed.  Recommendations were made.  Questions were answered.  The patient, wife, and CNAs expressed understanding & appreciation.    [name], M.D., F.A.C.S. Gastrointestinal and Minimally Invasive Surgery [facility] [address], [place], [place] [address] [phone] Main / Paging   09/01/2014   Results:   Labs: No results found for this  or any previous visit (from the past 48 hour(s)).  Imaging / Studies: No results found.  Medications / Allergies: per chart  Antibiotics: Anti-infectives   Start     Dose/Rate Route Frequency Ordered Stop   08/29/14 2000  cefoTEtan (CEFOTAN) 2 g in dextrose 5 % 50 mL IVPB     2 g 100 mL/hr over 30  Minutes Intravenous Every 12 hours 08/29/14 1602 08/29/14 2050   08/29/14 1255  clindamycin (CLEOCIN) 900 mg, gentamicin (GARAMYCIN) 240 mg in sodium chloride 0.9 % 1,000 mL for intraperitoneal lavage  Status:  Discontinued       As needed 08/29/14 1255 08/29/14 1350   08/29/14 0607  cefoTEtan (CEFOTAN) 2 g in dextrose 5 % 50 mL IVPB     2 g 100 mL/hr over 30 Minutes Intravenous On call to O.R. 08/29/14 3570 08/29/14 0747   08/29/14 0600  clindamycin (CLEOCIN) 900 mg, gentamicin (GARAMYCIN) 240 mg in sodium chloride 0.9 % 1,000 mL for intraperitoneal lavage  Status:  Discontinued    Comments:  Pharmacy may adjust dosing strength, schedule, rate of infusion, etc as needed to optimize therapy    Intraperitoneal To Surgery 08/28/14 1645 08/29/14 1531       Note: Portions of this report may have been transcribed using voice recognition software. Every effort was made to ensure accuracy; however, inadvertent computerized transcription errors may be present.   Any transcriptional errors that result from this process are unintentional.

## 2014-09-02 ENCOUNTER — Telehealth (INDEPENDENT_AMBULATORY_CARE_PROVIDER_SITE_OTHER): Payer: Self-pay

## 2014-09-02 NOTE — Consult Note (Signed)
WOC ostomy consult note Stoma type/location: RMQ ileostomy Stomal assessment/size: 1 and 3/4 inches oblong. Red, moist, budded Peristomal assessment: intact, clear Treatment options for stomal/peristomal skin: none indicated; skin barrier ring used in anticipation of stomal shrinkage Output thin brown stool Ostomy pouching: 2pc. 2 and 1/4 inch pouching system with skin barrier ring Education provided: Extended session with patient and his wife to teach A&P, stoma and pouch characteristics.  Patient is happy to see 2-piece pouching system and understands Lock and Roll closure mechanism immediately.  He is able to demonstrate this several times.  Stoma sizing taught and return demo received from wife.  Both understand that stoma will change size and shape as edema resides  Dietary considerations (including BRAT diet) as well as standard changing routine and emptying routine explained. Return to ADLs taught and sexuality/intimacy discussed. Supplies provided and registered with [employer] per his request.. Thanks, Maudie Flakes, MSN, RN, [place], [place], [place] [phone])

## 2014-09-02 NOTE — Progress Notes (Addendum)
Patient is alert and oriented, vital signs are stable, incisions are within normal limits, dressing to lower abdomen changed prior to patient being discharge, discharge instructions reviewed with patient and spouse, patient to follow up with [facility], reviewed ostomy care such as emptying pouch, pt questions and concerns answered Neta Mends RN 09-02-2014 14:01pm

## 2014-09-02 NOTE — Telephone Encounter (Signed)
Added pt to GI cancer conf for 09/17/14 b/c this is the next conf.date.

## 2014-09-02 NOTE — Discharge Summary (Signed)
Physician Discharge Summary  Patient ID: Darren Hill MRN: [id number] DOB/AGE: 46-May-1967 46 y.o.  Admit date: 08/29/2014 Discharge date: 09/02/2014  Patient Care Team: Ladell Pier, MD as PCP - General (Oncology) Ladell Pier, MD as Consulting Physician (Oncology) Marye Round, MD as Consulting Physician (Radiation Oncology) Missy Sabins, MD as Consulting Physician (Gastroenterology) Michael Boston, MD as Consulting Physician (General Surgery) Josue Hector, MD as Consulting Physician (Cardiology)  Admission Diagnoses: Principal Problem:   Rectal cancer s/p robotic LAR/loop ileostomy 08/29/2014   Discharge Diagnoses:  Principal Problem:   Rectal cancer s/p robotic LAR/loop ileostomy 08/29/2014   POST-OPERATIVE DIAGNOSIS:  MID RECTAL CANCER  SURGERY:  Procedure(s): MINIMALLY INVASIVE ROBOTIC/LAPAROSCOPIC, LOW ANTERIOR RESECTION, DIVERTING LOOP ILEOSTOMY, SPLENIC FLEXURE MOBILIZATION RIGID PROCTOSCOPY  SURGEON:  Surgeon(s): Michael Boston, MD Leighton Ruff, MD  Consults: Wound ostomy nurse  Hospital Course:   The patient underwent the surgery above.  Postoperatively, the patient gradually mobilized and advanced to a solid diet.  Pain and other symptoms were treated aggressively.    By the time of discharge, the patient was walking well the hallways, eating food, having flatus.  Pain was well-controlled on an oral medications.  Ileostomy output controlled with when necessary Imodium and iron.  The patient received ileostomy care training by wound ostomy nursing team.  Outpatient instructions, information, home health set up for followup as well.  Based on meeting discharge criteria and continuing to recover, I felt it was safe for the patient to be discharged from the hospital to further recover with close followup. Postoperative recommendations were discussed in detail.  They are written as well.   Significant Diagnostic Studies:  No results found for this or any  previous visit (from the past 72 hour(s)).  No results found.  Discharge Exam: Blood pressure 133/74, pulse 99, temperature 97.9 F (36.6 C), temperature source Oral, resp. rate 18, height 5\' 9"  (1.753 m), weight 174 lb 4.8 oz (79.062 kg), SpO2 100.00%.  General: Pt awake/alert/oriented x4 in no major acute distress Eyes: PERRL, normal EOM. Sclera nonicteric Neuro: CN II-XII intact w/o focal sensory/motor deficits. Lymph: No head/neck/groin lymphadenopathy Psych:  No delerium/psychosis/paranoia HENT: Normocephalic, Mucus membranes moist.  No thrush Neck: Supple, No tracheal deviation Chest: No pain.  Good respiratory excursion. CV:  Pulses intact.  Regular rhythm MS: Normal AROM mjr joints.  No obvious deformity Abdomen: Soft, Nondistended.  Nontender.  No incarcerated hernias. Ext:  SCDs BLE.  No significant edema.  No cyanosis Skin: No petechiae / purpura  Discharged Condition: good   Past Medical History  Diagnosis Date  . Hypertension   . Colon cancer 04/03/14    rectal mass, colon ca  . Hx of radiation therapy 05/06/14-06/16/14    rectum  . CHEST PAIN 06/28/2010    Qualifier: Diagnosis of  By: Johnsie Cancel, MD, Rona Ravens   . Palpitations 06/28/2010    Qualifier: Diagnosis of  By: Johnsie Cancel, MD, Rona Ravens     Past Surgical History  Procedure Laterality Date  . Eus N/A 04/03/2014    Procedure: LOWER ENDOSCOPIC ULTRASOUND (EUS);  Surgeon: Arta Silence, MD;  Location: [facility] ENDOSCOPY;  Service: Endoscopy;  Laterality: N/A;  H/P in file cabinet ja  . Colon biopsy N/A 04/03/14    rectal mass=adenocarcinoma    History   Social History  . Marital Status: Married    Spouse Name: N/A    Number of Children: 2  . Years of Education: N/A   Occupational History  .  TRUCK DRIVER    Social History Main Topics  . Smoking status: Former Smoker -- 0.25 packs/day for 15 years    Types: Cigarettes    Quit date: 04/03/2006  . Smokeless tobacco: Never Used  . Alcohol Use: No   . Drug Use: No  . Sexual Activity: Yes   Other Topics Concern  . Not on file   Social History Narrative  . No narrative on file    Family History  Problem Relation Age of Onset  . Brain cancer Maternal Aunt     not sure if tumor did operation on brain    Current Facility-Administered Medications  Medication Dose Route Frequency Provider Last Rate Last Dose  . 0.9 %  sodium chloride infusion  250 mL Intravenous PRN Michael Boston, MD      . acetaminophen (TYLENOL) tablet 1,000 mg  1,000 mg Oral TID Michael Boston, MD   1,000 mg at 09/01/14 2119  . alum & mag hydroxide-simeth (MAALOX/MYLANTA) 200-200-20 MG/5ML suspension 30 mL  30 mL Oral Q6H PRN Michael Boston, MD      . diphenhydrAMINE (BENADRYL) 12.5 MG/5ML elixir 12.5 mg  12.5 mg Oral Q6H PRN Michael Boston, MD       Or  . diphenhydrAMINE (BENADRYL) injection 12.5 mg  12.5 mg Intravenous Q6H PRN Michael Boston, MD      . heparin injection 5,000 Units  5,000 Units Subcutaneous 3 times per day Michael Boston, MD   5,000 Units at 09/02/14 0558  . HYDROmorphone (DILAUDID) injection 0.5-2 mg  0.5-2 mg Intravenous Q1H PRN Michael Boston, MD   1 mg at 08/31/14 0816  . lactated ringers bolus 1,000 mL  1,000 mL Intravenous Q8H PRN Michael Boston, MD      . lip balm (CARMEX) ointment 1 application  1 application Topical BID Michael Boston, MD   1 application at 48/54/62 2120  . loperamide (IMODIUM) capsule 2 mg  2 mg Oral QHS Michael Boston, MD   2 mg at 09/01/14 2119  . loperamide (IMODIUM) capsule 2-4 mg  2-4 mg Oral Q8H PRN Michael Boston, MD      . magic mouthwash  15 mL Oral QID PRN Michael Boston, MD      . menthol-cetylpyridinium (CEPACOL) lozenge 3 mg  1 lozenge Oral PRN Michael Boston, MD      . metoprolol (LOPRESSOR) injection 5 mg  5 mg Intravenous Q6H PRN Michael Boston, MD      . multivitamins with iron tablet 1 tablet  1 tablet Oral Daily Michael Boston, MD   1 tablet at 09/01/14 [phone]  . ondansetron (ZOFRAN) tablet 4 mg  4 mg Oral Q6H PRN Michael Boston,  MD       Or  . ondansetron [facility]) injection 4 mg  4 mg Intravenous Q6H PRN Michael Boston, MD      . oxyCODONE (Oxy IR/ROXICODONE) immediate release tablet 5-10 mg  5-10 mg Oral Q4H PRN Michael Boston, MD   5 mg at 09/01/14 2119  . phenol (CHLORASEPTIC) mouth spray 2 spray  2 spray Mouth/Throat PRN Michael Boston, MD      . promethazine (PHENERGAN) injection 6.25-12.5 mg  6.25-12.5 mg Intravenous Q4H PRN Michael Boston, MD      . saccharomyces boulardii (FLORASTOR) capsule 250 mg  250 mg Oral BID Michael Boston, MD   250 mg at 09/01/14 2120  . sodium chloride 0.9 % injection 3 mL  3 mL Intravenous Q12H Michael Boston, MD   3 mL at 09/01/14 2120  .  sodium chloride 0.9 % injection 3 mL  3 mL Intravenous PRN Michael Boston, MD         No Known Allergies  Disposition: 01-Home or Self Care  Discharge Instructions   Call MD for:  extreme fatigue    Complete by:  As directed      Call MD for:  extreme fatigue    Complete by:  As directed      Call MD for:  hives    Complete by:  As directed      Call MD for:  hives    Complete by:  As directed      Call MD for:  persistant nausea and vomiting    Complete by:  As directed      Call MD for:  persistant nausea and vomiting    Complete by:  As directed      Call MD for:  redness, tenderness, or signs of infection (pain, swelling, redness, odor or green/yellow discharge around incision site)    Complete by:  As directed      Call MD for:  redness, tenderness, or signs of infection (pain, swelling, redness, odor or green/yellow discharge around incision site)    Complete by:  As directed      Call MD for:  severe uncontrolled pain    Complete by:  As directed      Call MD for:  severe uncontrolled pain    Complete by:  As directed      Call MD for:    Complete by:  As directed   Temperature > 101.37F     Call MD for:    Complete by:  As directed   Temperature > 101.37F     Diet - low sodium heart healthy    Complete by:  As directed      Diet - low  sodium heart healthy    Complete by:  As directed      Discharge instructions    Complete by:  As directed   Please see discharge instruction sheets.  Also refer to handout given an office.  Please call our office if you have any questions or concerns [phone]     Discharge instructions    Complete by:  As directed   Please see discharge instruction sheets.  Also refer to handout given an office.  Please call our office if you have any questions or concerns [phone]     Discharge wound care:    Complete by:  As directed   If you have closed incisions, shower and bathe over these incisions with soap and water every day.  Remove all surgical dressings on postoperative day #3.  You do not need to replace dressings over the closed incisions unless you feel more comfortable with a Band-Aid covering it.   If you have an open wound that requires packing, please see wound care instructions.  In general, remove all dressings, wash wound with soap and water and then replace with saline moistened gauze.  Do the dressing change at least every day.  Please call our office [phone] if you have further questions.     Discharge wound care:    Complete by:  As directed   If you have closed incisions, shower and bathe over these incisions with soap and water every day.  Remove all surgical dressings on postoperative day #3.  You do not need to replace dressings over the closed incisions unless you feel more comfortable  with a Band-Aid covering it.   If you have an open wound that requires packing, please see wound care instructions.  In general, remove all dressings, wash wound with soap and water and then replace with saline moistened gauze.  Do the dressing change at least every day.  Please call our office [phone] if you have further questions.     Driving Restrictions    Complete by:  As directed   No driving until off narcotics and can safely swerve away without pain during an emergency      Driving Restrictions    Complete by:  As directed   No driving until off narcotics and can safely swerve away without pain during an emergency     Increase activity slowly    Complete by:  As directed   Walk an hour a day.  Use 20-30 minute walks.  When you can walk 30 minutes without difficulty, increase to low impact/moderate activities such as biking, jogging, swimming, sexual activity..  Eventually can increase to unrestricted activity when not feeling pain.  If you feel pain: STOP![name]   Let pain protect you from overdoing it.  Use ice/heat/over-the-counter pain medications to help minimize his soreness.  Use pain prescriptions as needed to remain active.  It is better to take extra pain medications and be more active than to stay bedridden to avoid all pain medications.     Increase activity slowly    Complete by:  As directed   Walk an hour a day.  Use 20-30 minute walks.  When you can walk 30 minutes without difficulty, increase to low impact/moderate activities such as biking, jogging, swimming, sexual activity..  Eventually can increase to unrestricted activity when not feeling pain.  If you feel pain: STOP![name]   Let pain protect you from overdoing it.  Use ice/heat/over-the-counter pain medications to help minimize his soreness.  Use pain prescriptions as needed to remain active.  It is better to take extra pain medications and be more active than to stay bedridden to avoid all pain medications.     Lifting restrictions    Complete by:  As directed   Avoid heavy lifting initially.  Do not push through pain.  You have no specific weight limit.  Coughing and sneezing or four more stressful to your incision than any lifting you will do. Pain will protect you from injury.  Therefore, avoid intense activity until off all narcotic pain medications.  Coughing and sneezing or four more stressful to your incision than any lifting he will do.     Lifting restrictions    Complete by:  As directed   Avoid  heavy lifting initially.  Do not push through pain.  You have no specific weight limit.  Coughing and sneezing or four more stressful to your incision than any lifting you will do. Pain will protect you from injury.  Therefore, avoid intense activity until off all narcotic pain medications.  Coughing and sneezing or four more stressful to your incision than any lifting he will do.     May shower / Bathe    Complete by:  As directed      May shower / Bathe    Complete by:  As directed      May walk up steps    Complete by:  As directed      May walk up steps    Complete by:  As directed      Sexual Activity Restrictions  Complete by:  As directed   Sexual activity as tolerated.  Do not push through pain.  Pain will protect you from injury.     Sexual Activity Restrictions    Complete by:  As directed   Sexual activity as tolerated.  Do not push through pain.  Pain will protect you from injury.     Walk with assistance    Complete by:  As directed   Walk over an hour a day.  May use a walker/cane/companion to help with balance and stamina.     Walk with assistance    Complete by:  As directed   Walk over an hour a day.  May use a walker/cane/companion to help with balance and stamina.            Medication List    STOP taking these medications       metroNIDAZOLE 500 MG tablet  Commonly known as:  FLAGYL     neomycin 500 MG tablet  Commonly known as:  MYCIFRADIN      TAKE these medications       lisinopril 10 MG tablet  Commonly known as:  PRINIVIL,ZESTRIL  Take 10 mg by mouth every morning.     oxyCODONE 5 MG immediate release tablet  Commonly known as:  Oxy IR/ROXICODONE  Take 1-2 tablets (5-10 mg total) by mouth every 4 (four) hours as needed for moderate pain, severe pain or breakthrough pain.           Follow-up Information   Follow up with Jerlene Rockers C., MD In 2 weeks. (To follow up after your operation, To follow up after your hospital stay)     Specialty:  General Surgery   Contact information:   [place] [address] [phone]       Follow up with [facility] RN)    Contact information:   [address] [place] [address] [phone]        Signed: Morton Peters, M.D., F.A.C.S. Gastrointestinal and Minimally Invasive Surgery [facility] [address] [place], [place] [address] [phone] Main / Paging   09/02/2014, 7:26 AM

## 2014-09-03 ENCOUNTER — Encounter (INDEPENDENT_AMBULATORY_CARE_PROVIDER_SITE_OTHER): Payer: Self-pay | Admitting: Surgery

## 2014-09-10 ENCOUNTER — Encounter (HOSPITAL_COMMUNITY): Payer: Self-pay

## 2014-09-15 ENCOUNTER — Telehealth: Payer: Self-pay | Admitting: *Deleted

## 2014-09-15 NOTE — Telephone Encounter (Signed)
Left VM requesting confirmation that Dr. Benay Spice is appropriate MD for ostomy supply orders.

## 2014-09-15 NOTE — Telephone Encounter (Signed)
Called back to [facility] and confirmed our office will follow ostomy supplies. They will fax the order for signature

## 2014-09-16 ENCOUNTER — Telehealth: Payer: Self-pay | Admitting: Nurse Practitioner

## 2014-09-16 ENCOUNTER — Ambulatory Visit: Payer: Commercial Managed Care - PPO | Admitting: Oncology

## 2014-09-16 NOTE — Telephone Encounter (Signed)
Pt called states he forgot his apt and didn't get a reminder call, pt confirmed r/s pt with LT/MD no availability.... KJ

## 2014-09-18 ENCOUNTER — Encounter: Payer: Self-pay | Admitting: *Deleted

## 2014-09-18 ENCOUNTER — Ambulatory Visit (HOSPITAL_BASED_OUTPATIENT_CLINIC_OR_DEPARTMENT_OTHER): Payer: Commercial Managed Care - PPO | Admitting: Nurse Practitioner

## 2014-09-18 VITALS — BP 121/84 | HR 84 | Temp 97.8°F | Resp 18 | Ht 69.0 in | Wt 176.0 lb

## 2014-09-18 DIAGNOSIS — C20 Malignant neoplasm of rectum: Secondary | ICD-10-CM

## 2014-09-18 DIAGNOSIS — C772 Secondary and unspecified malignant neoplasm of intra-abdominal lymph nodes: Secondary | ICD-10-CM

## 2014-09-18 NOTE — Progress Notes (Addendum)
  [facility] OFFICE PROGRESS NOTE   Diagnosis:  Rectal cancer  INTERVAL HISTORY:   Mr. Reierson underwent a low anterior resection with diverting loop ileostomy on 08/29/2014. He reports the ileostomy output is thick similar to "oatmeal". No watery diarrhea. He has minimal abdominal pain. No nausea or vomiting. He has a good appetite. He denies fever, cough and shortness of breath.  Objective:  Vital signs in last 24 hours:  Blood pressure 121/84, pulse 84, temperature 97.8 F (36.6 C), temperature source Oral, resp. rate 18, height $RemoveBe'5\' 9"'SYeTNpQEs$  (1.753 m), weight 176 lb (79.833 kg).    HEENT: No thrush or ulcers. Resp: Lungs clear bilaterally. Cardio: Regular rate and rhythm. GI: Abdomen soft and nontender. Right abdomen ileostomy. Vascular: No leg edema.  Lab Results:  Lab Results  Component Value Date   WBC 9.3 08/30/2014   HGB 11.7* 08/30/2014   HCT 33.4* 08/30/2014   MCV 86.8 08/30/2014   PLT 244 08/30/2014   NEUTROABS 1.8 07/22/2014    Imaging:  No results found.  Medications: I have reviewed the patient's current medications.  Assessment/Plan: 1. Clinical stage II Rectal cancer, posterior rectal mass identified at 10-15 cm from the anal verge, a biopsy 03/31/2014 confirmed adenocarcinoma Endoscopic ultrasound 04/03/2014 revealed a (uT3,uN0) lesion  Initiation of radiation/Xeloda 05/05/2014, completed 06/16/2014. Low anterior resection with diverting loop ileostomy 08/29/2014. Pathology showed invasive adenocarcinoma arising in a background of tubular adenoma invading into but not through the muscularis propria. There was no evidence of angiolymphatic invasion or perineural invasion. There was one hyperplastic polyp with no adenomatous change or malignancy. One of 12 lymph nodes was positive for metastatic carcinoma. Resection margins were negative for atypia or malignancy. Pathologic staging pT2, pN1a.  Microsatellite stable; preserved expression of the major and  minor MMR proteins. 2. Hypertension.   Disposition: Mr. Blancett continues to recover from the recent surgery. Dr. Benay Spice reviewed the pathology with Mr. Spikes in detail at today's visit and recommends a course of adjuvant chemotherapy with either FOLFOX or CAPOX. We reviewed potential toxicities associated with oxaliplatin including myelosuppression, allergic reaction, nausea, neurotoxicity. We also reviewed potential toxicities associated with 5 fluorouracil/Xeloda. He was provided with written information as well. We discussed that he would likely require a Port-A-Cath.  Mr. Ulbrich declines oxaliplatin chemotherapy. He is agreeable to completing adjuvant chemotherapy with Xeloda for 5 or 6 cycles. The Xeloda will be given on a 2 week on/1 week off schedule. We anticipate he will begin the first cycle on 09/25/2014.  He will return for a followup visit on 10/10/2014. He will contact the office in the interim with any problems.  Patient seen with Dr. Benay Spice. 30 minutes were spent face-to-face at today's visit with the majority of the time involved in counseling/coordination of care.    Ned Card ANP/GNP-BC   09/18/2014  5:01 PM   This was a shared visit with Ned Card. We reviewed the surgical pathology findings with Mr. Brayfield. I discussed the prognosis and adjuvant treatment options. I recommend adjuvant 5-FU and oxaliplatin chemotherapy. He does not wish to receive oxaliplatin. He agrees to a course of adjuvant capecitabine. He will begin capecitabine 09/25/2014.  Julieanne Manson, M.D.

## 2014-09-19 ENCOUNTER — Other Ambulatory Visit: Payer: Self-pay | Admitting: *Deleted

## 2014-09-19 ENCOUNTER — Telehealth: Payer: Self-pay | Admitting: Oncology

## 2014-09-19 DIAGNOSIS — C772 Secondary and unspecified malignant neoplasm of intra-abdominal lymph nodes: Principal | ICD-10-CM

## 2014-09-19 DIAGNOSIS — C20 Malignant neoplasm of rectum: Secondary | ICD-10-CM

## 2014-09-19 MED ORDER — CAPECITABINE 500 MG PO TABS: ORAL_TABLET | ORAL | Status: DC

## 2014-09-19 NOTE — Telephone Encounter (Signed)
s.w pt and advised on NOV appt....pt ok adn aware °

## 2014-09-19 NOTE — Telephone Encounter (Signed)
, °

## 2014-09-22 ENCOUNTER — Other Ambulatory Visit (HOSPITAL_BASED_OUTPATIENT_CLINIC_OR_DEPARTMENT_OTHER): Payer: Commercial Managed Care - PPO

## 2014-09-22 DIAGNOSIS — C772 Secondary and unspecified malignant neoplasm of intra-abdominal lymph nodes: Principal | ICD-10-CM

## 2014-09-22 DIAGNOSIS — C20 Malignant neoplasm of rectum: Secondary | ICD-10-CM

## 2014-09-22 LAB — COMPREHENSIVE METABOLIC PANEL (CC13)
ALBUMIN: 3.7 g/dL (ref 3.5–5.0)
ALT: 16 U/L (ref 0–55)
AST: 21 U/L (ref 5–34)
Alkaline Phosphatase: 77 U/L (ref 40–150)
Anion Gap: 6 mEq/L (ref 3–11)
BUN: 7.7 mg/dL (ref 7.0–26.0)
CO2: 28 mEq/L (ref 22–29)
Calcium: 9.8 mg/dL (ref 8.4–10.4)
Chloride: 103 mEq/L (ref 98–109)
Creatinine: 1.3 mg/dL (ref 0.7–1.3)
Glucose: 98 mg/dl (ref 70–140)
POTASSIUM: 4.5 meq/L (ref 3.5–5.1)
SODIUM: 137 meq/L (ref 136–145)
TOTAL PROTEIN: 6.9 g/dL (ref 6.4–8.3)
Total Bilirubin: 0.41 mg/dL (ref 0.20–1.20)

## 2014-09-22 LAB — CBC WITH DIFFERENTIAL/PLATELET
BASO%: 1.1 % (ref 0.0–2.0)
Basophils Absolute: 0 10*3/uL (ref 0.0–0.1)
EOS%: 3.7 % (ref 0.0–7.0)
Eosinophils Absolute: 0.2 10*3/uL (ref 0.0–0.5)
HCT: 38 % — ABNORMAL LOW (ref 38.4–49.9)
HGB: 12.5 g/dL — ABNORMAL LOW (ref 13.0–17.1)
LYMPH#: 1.1 10*3/uL (ref 0.9–3.3)
LYMPH%: 24.9 % (ref 14.0–49.0)
MCH: 29.5 pg (ref 27.2–33.4)
MCHC: 33 g/dL (ref 32.0–36.0)
MCV: 89.3 fL (ref 79.3–98.0)
MONO#: 0.5 10*3/uL (ref 0.1–0.9)
MONO%: 11.6 % (ref 0.0–14.0)
NEUT#: 2.6 10*3/uL (ref 1.5–6.5)
NEUT%: 58.7 % (ref 39.0–75.0)
Platelets: 410 10*3/uL — ABNORMAL HIGH (ref 140–400)
RBC: 4.25 10*6/uL (ref 4.20–5.82)
RDW: 12.9 % (ref 11.0–14.6)
WBC: 4.4 10*3/uL (ref 4.0–10.3)

## 2014-09-24 ENCOUNTER — Telehealth: Payer: Self-pay | Admitting: *Deleted

## 2014-09-24 ENCOUNTER — Other Ambulatory Visit: Payer: Self-pay | Admitting: Oncology

## 2014-09-24 NOTE — Telephone Encounter (Signed)
Call from pt to follow up on Xeloda Rx. Called [facility], they will need to speak with pt to set up delivery. Pt instructed to call pharmacy.

## 2014-09-26 ENCOUNTER — Telehealth: Payer: Self-pay | Admitting: *Deleted

## 2014-09-26 NOTE — Telephone Encounter (Addendum)
Call from pt asking if it is OK to continue his multivitamin with iron while on Xeloda. Also concerned about difficulty with ejaculation during intercourse. Asking if this is related to Xeloda? Reviewed with Dr. Benay Spice: STOP multivitamin while on Xeloda. Ejaculation difficulty related to surgery/ radiation. Discuss with Dr. Johney Maine.  Pt voiced understanding, he will discontinue multivitamin.

## 2014-10-10 ENCOUNTER — Telehealth: Payer: Self-pay | Admitting: Oncology

## 2014-10-10 ENCOUNTER — Other Ambulatory Visit (HOSPITAL_BASED_OUTPATIENT_CLINIC_OR_DEPARTMENT_OTHER): Payer: Commercial Managed Care - PPO

## 2014-10-10 ENCOUNTER — Other Ambulatory Visit: Payer: Self-pay | Admitting: *Deleted

## 2014-10-10 ENCOUNTER — Ambulatory Visit (HOSPITAL_BASED_OUTPATIENT_CLINIC_OR_DEPARTMENT_OTHER): Payer: Commercial Managed Care - PPO | Admitting: Nurse Practitioner

## 2014-10-10 VITALS — BP 133/91 | HR 82 | Temp 97.7°F | Resp 19 | Ht 69.0 in | Wt 171.6 lb

## 2014-10-10 DIAGNOSIS — C20 Malignant neoplasm of rectum: Secondary | ICD-10-CM

## 2014-10-10 DIAGNOSIS — C772 Secondary and unspecified malignant neoplasm of intra-abdominal lymph nodes: Secondary | ICD-10-CM

## 2014-10-10 LAB — CBC WITH DIFFERENTIAL/PLATELET
BASO%: 0.3 % (ref 0.0–2.0)
Basophils Absolute: 0 10*3/uL (ref 0.0–0.1)
EOS ABS: 0.1 10*3/uL (ref 0.0–0.5)
EOS%: 3.4 % (ref 0.0–7.0)
HCT: 37.2 % — ABNORMAL LOW (ref 38.4–49.9)
HGB: 12.8 g/dL — ABNORMAL LOW (ref 13.0–17.1)
LYMPH%: 34.7 % (ref 14.0–49.0)
MCH: 29.9 pg (ref 27.2–33.4)
MCHC: 34.4 g/dL (ref 32.0–36.0)
MCV: 86.9 fL (ref 79.3–98.0)
MONO#: 0.5 10*3/uL (ref 0.1–0.9)
MONO%: 14.6 % — AB (ref 0.0–14.0)
NEUT%: 47 % (ref 39.0–75.0)
NEUTROS ABS: 1.7 10*3/uL (ref 1.5–6.5)
PLATELETS: 272 10*3/uL (ref 140–400)
RBC: 4.28 10*6/uL (ref 4.20–5.82)
RDW: 13.7 % (ref 11.0–14.6)
WBC: 3.6 10*3/uL — ABNORMAL LOW (ref 4.0–10.3)
lymph#: 1.2 10*3/uL (ref 0.9–3.3)

## 2014-10-10 LAB — COMPREHENSIVE METABOLIC PANEL (CC13)
ALK PHOS: 60 U/L (ref 40–150)
ALT: 15 U/L (ref 0–55)
AST: 25 U/L (ref 5–34)
Albumin: 4.1 g/dL (ref 3.5–5.0)
Anion Gap: 8 mEq/L (ref 3–11)
BUN: 9.7 mg/dL (ref 7.0–26.0)
CO2: 28 mEq/L (ref 22–29)
Calcium: 9.7 mg/dL (ref 8.4–10.4)
Chloride: 102 mEq/L (ref 98–109)
Creatinine: 1.4 mg/dL — ABNORMAL HIGH (ref 0.7–1.3)
GLUCOSE: 94 mg/dL (ref 70–140)
Potassium: 4.2 mEq/L (ref 3.5–5.1)
Sodium: 138 mEq/L (ref 136–145)
TOTAL PROTEIN: 7.2 g/dL (ref 6.4–8.3)
Total Bilirubin: 0.77 mg/dL (ref 0.20–1.20)

## 2014-10-10 MED ORDER — CAPECITABINE 500 MG PO TABS
ORAL_TABLET | ORAL | Status: DC
Start: 2014-10-10 — End: 2014-10-14

## 2014-10-10 NOTE — Progress Notes (Signed)
  [facility] OFFICE PROGRESS NOTE   Diagnosis:  Rectal cancer   INTERVAL HISTORY:   Darren Hill returns as scheduled. He completed cycle 1 adjuvant Xeloda beginning 09/25/2014. He denies nausea/vomiting. No mouth sores. No diarrhea. No hand or foot pain or redness. He empties the ileostomy bag 4-5 times a day. He describes the output has "thick". He expresses concern that he does not ejaculate with intercourse.  Objective:  Vital signs in last 24 hours:  Blood pressure 133/91, pulse 82, temperature 97.7 F (36.5 C), temperature source Oral, resp. rate 19, height $RemoveBe'5\' 9"'[id number]$  (1.753 m), weight 171 lb 9.6 oz (77.837 kg), SpO2 100 %.    HEENT: no thrush or ulcers. Resp: lungs clear bilaterally. Cardio: regular rate and rhythm. GI: abdomen soft and nontender. No hepatomegaly. Right lower quadrant ileostomy. Vascular: no leg edema. Neuro:alert and oriented.  Skin:palms without erythema.    Lab Results:  Lab Results  Component Value Date   WBC 3.6* 10/10/2014   HGB 12.8* 10/10/2014   HCT 37.2* 10/10/2014   MCV 86.9 10/10/2014   PLT 272 10/10/2014   NEUTROABS 1.7 10/10/2014    Imaging:  No results found.  Medications: I have reviewed the patient's current medications.  Assessment/Plan: 1. Clinical stage II Rectal cancer, posterior rectal mass identified at 10-15 cm from the anal verge, a biopsy 03/31/2014 confirmed adenocarcinoma  Endoscopic ultrasound 04/03/2014 revealed a (uT3,uN0) lesion   Initiation of radiation/Xeloda 05/05/2014, completed 06/16/2014.  Low anterior resection with diverting loop ileostomy 08/29/2014. Pathology showed invasive adenocarcinoma arising in a background of tubular adenoma invading into but not through the muscularis propria. There was no evidence of angiolymphatic invasion or perineural invasion. There was one hyperplastic polyp with no adenomatous change or malignancy. One of 12 lymph nodes was positive for metastatic carcinoma.  Resection margins were negative for atypia or malignancy. Pathologic staging pT2, pN1a.   Microsatellite stable; preserved expression of the major and minor MMR proteins.  Cycle 1 adjuvant Xeloda 09/25/2014. 2. Hypertension.   Disposition: Darren Hill appears stable. He has completed one cycle of adjuvant Xeloda. He will proceed with cycle 2 beginning 10/16/2014. He will return for a followup visit on 11/03/2014. He will contact the office in the interim with any problems.   He will discuss the ejaculation problem with Dr. Johney Hill.  Plan reviewed with Dr. Benay Hill.  Darren Hill ANP/GNP-BC   10/10/2014  9:49 AM

## 2014-10-10 NOTE — Telephone Encounter (Signed)
Faxed Xeloda prescription to [facility].

## 2014-10-10 NOTE — Telephone Encounter (Signed)
gv and printed appt sched and avs for pt for DEC °

## 2014-10-13 ENCOUNTER — Other Ambulatory Visit: Payer: Self-pay | Admitting: Oncology

## 2014-10-14 ENCOUNTER — Other Ambulatory Visit: Payer: Self-pay | Admitting: Oncology

## 2014-10-14 ENCOUNTER — Telehealth: Payer: Self-pay

## 2014-10-14 NOTE — Telephone Encounter (Signed)
Pt calling in regards to Xeloda RX, called and spoke with   [facility], pt prescription will be there tomorrow, they spoke with the patient this morning. Called and spoke with patient, states they did get ahold of them and clarified the medication was to be delivered tomorrow. Informed pt to call back with any other questions or concerns.

## 2014-10-17 ENCOUNTER — Encounter: Payer: Self-pay | Admitting: Oncology

## 2014-10-17 ENCOUNTER — Other Ambulatory Visit: Payer: Self-pay | Admitting: *Deleted

## 2014-10-17 NOTE — Telephone Encounter (Signed)
Refill request for Xeloda to MD desk for approval. Cycle due 11/06/14

## 2014-11-03 ENCOUNTER — Other Ambulatory Visit: Payer: Self-pay | Admitting: *Deleted

## 2014-11-03 ENCOUNTER — Other Ambulatory Visit (HOSPITAL_BASED_OUTPATIENT_CLINIC_OR_DEPARTMENT_OTHER): Payer: Commercial Managed Care - PPO

## 2014-11-03 ENCOUNTER — Ambulatory Visit (HOSPITAL_BASED_OUTPATIENT_CLINIC_OR_DEPARTMENT_OTHER): Payer: Commercial Managed Care - PPO | Admitting: Oncology

## 2014-11-03 ENCOUNTER — Telehealth: Payer: Self-pay | Admitting: Oncology

## 2014-11-03 VITALS — BP 125/89 | HR 68 | Temp 97.5°F | Resp 18 | Ht 69.0 in | Wt 173.4 lb

## 2014-11-03 DIAGNOSIS — C20 Malignant neoplasm of rectum: Secondary | ICD-10-CM

## 2014-11-03 DIAGNOSIS — C772 Secondary and unspecified malignant neoplasm of intra-abdominal lymph nodes: Secondary | ICD-10-CM

## 2014-11-03 LAB — CBC WITH DIFFERENTIAL/PLATELET
BASO%: 1.1 % (ref 0.0–2.0)
Basophils Absolute: 0 10*3/uL (ref 0.0–0.1)
EOS ABS: 0.2 10*3/uL (ref 0.0–0.5)
EOS%: 4 % (ref 0.0–7.0)
HCT: 38.3 % — ABNORMAL LOW (ref 38.4–49.9)
HGB: 13.1 g/dL (ref 13.0–17.1)
LYMPH%: 37.1 % (ref 14.0–49.0)
MCH: 30.3 pg (ref 27.2–33.4)
MCHC: 34.2 g/dL (ref 32.0–36.0)
MCV: 88.5 fL (ref 79.3–98.0)
MONO#: 0.5 10*3/uL (ref 0.1–0.9)
MONO%: 13.5 % (ref 0.0–14.0)
NEUT%: 44.3 % (ref 39.0–75.0)
NEUTROS ABS: 1.7 10*3/uL (ref 1.5–6.5)
PLATELETS: 267 10*3/uL (ref 140–400)
RBC: 4.33 10*6/uL (ref 4.20–5.82)
RDW: 16.1 % — ABNORMAL HIGH (ref 11.0–14.6)
WBC: 3.8 10*3/uL — AB (ref 4.0–10.3)
lymph#: 1.4 10*3/uL (ref 0.9–3.3)

## 2014-11-03 LAB — COMPREHENSIVE METABOLIC PANEL (CC13)
ALT: 18 U/L (ref 0–55)
ANION GAP: 8 meq/L (ref 3–11)
AST: 22 U/L (ref 5–34)
Albumin: 3.9 g/dL (ref 3.5–5.0)
Alkaline Phosphatase: 58 U/L (ref 40–150)
BUN: 9.7 mg/dL (ref 7.0–26.0)
CO2: 28 meq/L (ref 22–29)
CREATININE: 1.4 mg/dL — AB (ref 0.7–1.3)
Calcium: 9 mg/dL (ref 8.4–10.4)
Chloride: 102 mEq/L (ref 98–109)
EGFR: 72 mL/min/{1.73_m2} — AB (ref >90)
GLUCOSE: 91 mg/dL (ref 70–140)
Potassium: 3.8 mEq/L (ref 3.5–5.1)
Sodium: 138 mEq/L (ref 136–145)
Total Bilirubin: 0.34 mg/dL (ref 0.20–1.20)
Total Protein: 6.9 g/dL (ref 6.4–8.3)

## 2014-11-03 NOTE — Progress Notes (Signed)
  [place] OFFICE PROGRESS NOTE   Diagnosis: Rectal cancer  INTERVAL HISTORY:   Darren Hill returns as scheduled. He completed another cycle of Xeloda beginning 10/16/2014. No mouth sores, diarrhea, or hand/foot pain. He empties the ileostomy multiple times per day. He reports the stool is formed. He continues working.  Objective:  Vital signs in last 24 hours:  Blood pressure 125/89, pulse 68, temperature 97.5 F (36.4 C), resp. rate 18, height _0  (1.753 m), weight 173 lb 6.4 oz (78.654 kg).    HEENT: No thrush or ulcers Resp: Lungs clear bilaterally Cardio: Regular rate and rhythm GI: No hepatomegaly, right lower quadrant ileostomy Vascular: No leg edema  Skin: Hyperpigmentation and skin thickening of the palms. No erythema. Soles with dryness.     Lab Results:  Lab Results  Component Value Date   WBC 3.8* 11/03/2014   HGB 13.1 11/03/2014   HCT 38.3* 11/03/2014   MCV 88.5 11/03/2014   PLT 267 11/03/2014   NEUTROABS 1.7 11/03/2014     Medications: I have reviewed the patient's current medications.  Assessment/Plan: 1. Clinical stage II Rectal cancer, posterior rectal mass identified at 10-15 cm from the anal verge, a biopsy 03/31/2014 confirmed adenocarcinoma  Endoscopic ultrasound 04/03/2014 revealed a (uT3,uN0) lesion   Initiation of radiation/Xeloda 05/05/2014, completed 06/16/2014.  Low anterior resection with diverting loop ileostomy 08/29/2014. Pathology showed invasive adenocarcinoma arising in a background of tubular adenoma invading into but not through the muscularis propria. There was no evidence of angiolymphatic invasion or perineural invasion. There was one hyperplastic polyp with no adenomatous change or malignancy. One of 12 lymph nodes was positive for metastatic carcinoma. Resection margins were negative for atypia or malignancy. Pathologic staging pT2, pN1a.   Microsatellite stable; preserved expression of the major and minor  MMR proteins.  Cycle 1 adjuvant Xeloda 09/25/2014.  Cycle 2 adjuvant Xeloda 10/16/2014 2. Hypertension.   Disposition:  He is tolerating the adjuvant Xeloda well. The plan is to begin cycle 3 on 11/06/2014. Mr. Nodarse will return for an office and lab visit on 11/24/2014.  Betsy Coder, MD  11/03/2014  8:40 AM

## 2014-11-03 NOTE — Telephone Encounter (Signed)
gv and printed appt sched and avs for pt for Dec °

## 2014-11-18 NOTE — Progress Notes (Addendum)
10/17/14 FAXED 28 PAGES TO CASE Windy Carina, RN @ [facility] [place] HER PHONE # IS [phone].  11/18/14 FAXED 5 PAGES TO Florentina Jenny, [facility] 11/03/14 OFFICE NOTE.

## 2014-11-18 NOTE — Progress Notes (Deleted)
11/18/14 FAXED 5 PAGES TO Florentina Jenny, RN @ [facility] 11/03/14 OFFICE VISIT.

## 2014-11-24 ENCOUNTER — Telehealth: Payer: Self-pay | Admitting: Oncology

## 2014-11-24 ENCOUNTER — Encounter: Payer: Self-pay | Admitting: Oncology

## 2014-11-24 ENCOUNTER — Other Ambulatory Visit (HOSPITAL_BASED_OUTPATIENT_CLINIC_OR_DEPARTMENT_OTHER): Payer: Commercial Managed Care - PPO

## 2014-11-24 ENCOUNTER — Ambulatory Visit (HOSPITAL_BASED_OUTPATIENT_CLINIC_OR_DEPARTMENT_OTHER): Payer: Commercial Managed Care - PPO | Admitting: Oncology

## 2014-11-24 VITALS — BP 114/76 | HR 70 | Temp 97.9°F | Resp 18 | Ht 69.0 in | Wt 170.9 lb

## 2014-11-24 DIAGNOSIS — C772 Secondary and unspecified malignant neoplasm of intra-abdominal lymph nodes: Secondary | ICD-10-CM

## 2014-11-24 DIAGNOSIS — C20 Malignant neoplasm of rectum: Secondary | ICD-10-CM

## 2014-11-24 LAB — CBC WITH DIFFERENTIAL/PLATELET
BASO%: 0.9 % (ref 0.0–2.0)
Basophils Absolute: 0 10*3/uL (ref 0.0–0.1)
EOS%: 4.3 % (ref 0.0–7.0)
Eosinophils Absolute: 0.2 10*3/uL (ref 0.0–0.5)
HCT: 41.4 % (ref 38.4–49.9)
HEMOGLOBIN: 13.7 g/dL (ref 13.0–17.1)
LYMPH%: 31.7 % (ref 14.0–49.0)
MCH: 30.1 pg (ref 27.2–33.4)
MCHC: 33 g/dL (ref 32.0–36.0)
MCV: 91.2 fL (ref 79.3–98.0)
MONO#: 0.5 10*3/uL (ref 0.1–0.9)
MONO%: 15.6 % — ABNORMAL HIGH (ref 0.0–14.0)
NEUT%: 47.5 % (ref 39.0–75.0)
NEUTROS ABS: 1.7 10*3/uL (ref 1.5–6.5)
Platelets: 253 10*3/uL (ref 140–400)
RBC: 4.54 10*6/uL (ref 4.20–5.82)
RDW: 19 % — AB (ref 11.0–14.6)
WBC: 3.5 10*3/uL — AB (ref 4.0–10.3)
lymph#: 1.1 10*3/uL (ref 0.9–3.3)

## 2014-11-24 LAB — COMPREHENSIVE METABOLIC PANEL (CC13)
ALBUMIN: 4.2 g/dL (ref 3.5–5.0)
ALT: 31 U/L (ref 0–55)
ANION GAP: 8 meq/L (ref 3–11)
AST: 36 U/L — AB (ref 5–34)
Alkaline Phosphatase: 60 U/L (ref 40–150)
BUN: 9.7 mg/dL (ref 7.0–26.0)
CALCIUM: 9.4 mg/dL (ref 8.4–10.4)
CHLORIDE: 101 meq/L (ref 98–109)
CO2: 29 meq/L (ref 22–29)
Creatinine: 1.4 mg/dL — ABNORMAL HIGH (ref 0.7–1.3)
EGFR: 71 mL/min/{1.73_m2} — ABNORMAL LOW (ref >90)
Glucose: 96 mg/dl (ref 70–140)
POTASSIUM: 3.8 meq/L (ref 3.5–5.1)
Sodium: 138 mEq/L (ref 136–145)
Total Bilirubin: 0.63 mg/dL (ref 0.20–1.20)
Total Protein: 7.5 g/dL (ref 6.4–8.3)

## 2014-11-24 NOTE — Telephone Encounter (Signed)
Pt confirmed labs/ov per 12/28 POF, gave pt AVS.... KJ °

## 2014-11-24 NOTE — Progress Notes (Signed)
  [place] OFFICE PROGRESS NOTE   Diagnosis: Rectal cancer  INTERVAL HISTORY:   Mr. Scow returns as scheduled. He completed another cycle of Xeloda beginning 11/06/2014. No mouth sores, diarrhea, or hand/foot pain. He empties the ileostomy multiple times per day. He reports the stool is formed. He continues working.  Objective:  Vital signs in last 24 hours:  Blood pressure 114/76, pulse 70, temperature 97.9 F (36.6 C), temperature source Oral, resp. rate 18, height _0  (1.753 m), weight 170 lb 14.4 oz (77.52 kg), SpO2 100 %.    HEENT: No thrush or ulcers Resp: Lungs clear bilaterally Cardio: Regular rate and rhythm GI: No hepatomegaly, right lower quadrant ileostomy Vascular: No leg edema  Skin: Hyperpigmentation and skin thickening of the palms. No erythema. Soles with dryness.     Lab Results:  Lab Results  Component Value Date   WBC 3.5* 11/24/2014   HGB 13.7 11/24/2014   HCT 41.4 11/24/2014   MCV 91.2 11/24/2014   PLT 253 11/24/2014   NEUTROABS 1.7 11/24/2014     Medications: I have reviewed the patient's current medications.  Assessment/Plan: 1. Clinical stage II Rectal cancer, posterior rectal mass identified at 10-15 cm from the anal verge, a biopsy 03/31/2014 confirmed adenocarcinoma  Endoscopic ultrasound 04/03/2014 revealed a (uT3,uN0) lesion   Initiation of radiation/Xeloda 05/05/2014, completed 06/16/2014.  Low anterior resection with diverting loop ileostomy 08/29/2014. Pathology showed invasive adenocarcinoma arising in a background of tubular adenoma invading into but not through the muscularis propria. There was no evidence of angiolymphatic invasion or perineural invasion. There was one hyperplastic polyp with no adenomatous change or malignancy. One of 12 lymph nodes was positive for metastatic carcinoma. Resection margins were negative for atypia or malignancy. Pathologic staging pT2, pN1a.   Microsatellite stable;  preserved expression of the major and minor MMR proteins.  Cycle 1 adjuvant Xeloda 09/25/2014.  Cycle 2 adjuvant Xeloda 10/16/2014  Cycle 3 adjuvant Xeloda 11/06/2014 2. Hypertension.   Disposition:  He is tolerating the adjuvant Xeloda well. The plan is to begin cycle 4 on 11/27/2014. Mr Geno was hoping to be done with his Xeloda sooner that early February so that he can go back to surgery. He would like all of this completed before spring/summer time. Mr. Disano will return for an office and lab visit the week of 12/15/14 to discuss further management.  Mikey Bussing, DNP, [place], AOCNP  11/24/2014  12:47 PM

## 2014-12-16 ENCOUNTER — Telehealth: Payer: Self-pay | Admitting: *Deleted

## 2014-12-16 ENCOUNTER — Ambulatory Visit (HOSPITAL_BASED_OUTPATIENT_CLINIC_OR_DEPARTMENT_OTHER): Payer: Commercial Managed Care - PPO | Admitting: Oncology

## 2014-12-16 ENCOUNTER — Telehealth: Payer: Self-pay | Admitting: Oncology

## 2014-12-16 ENCOUNTER — Other Ambulatory Visit (HOSPITAL_BASED_OUTPATIENT_CLINIC_OR_DEPARTMENT_OTHER): Payer: Commercial Managed Care - PPO

## 2014-12-16 VITALS — BP 129/83 | HR 72 | Temp 97.4°F | Resp 19 | Ht 69.0 in | Wt 174.3 lb

## 2014-12-16 DIAGNOSIS — C772 Secondary and unspecified malignant neoplasm of intra-abdominal lymph nodes: Secondary | ICD-10-CM

## 2014-12-16 DIAGNOSIS — C20 Malignant neoplasm of rectum: Secondary | ICD-10-CM

## 2014-12-16 LAB — CBC WITH DIFFERENTIAL/PLATELET
BASO%: 1 % (ref 0.0–2.0)
BASOS ABS: 0 10*3/uL (ref 0.0–0.1)
EOS ABS: 0.2 10*3/uL (ref 0.0–0.5)
EOS%: 4.1 % (ref 0.0–7.0)
HEMATOCRIT: 40.8 % (ref 38.4–49.9)
HEMOGLOBIN: 13.2 g/dL (ref 13.0–17.1)
LYMPH%: 34.5 % (ref 14.0–49.0)
MCH: 30 pg (ref 27.2–33.4)
MCHC: 32.3 g/dL (ref 32.0–36.0)
MCV: 92.9 fL (ref 79.3–98.0)
MONO#: 0.6 10*3/uL (ref 0.1–0.9)
MONO%: 15.7 % — ABNORMAL HIGH (ref 0.0–14.0)
NEUT#: 1.7 10*3/uL (ref 1.5–6.5)
NEUT%: 44.7 % (ref 39.0–75.0)
Platelets: 261 10*3/uL (ref 140–400)
RBC: 4.39 10*6/uL (ref 4.20–5.82)
RDW: 20.7 % — ABNORMAL HIGH (ref 11.0–14.6)
WBC: 3.7 10*3/uL — ABNORMAL LOW (ref 4.0–10.3)
lymph#: 1.3 10*3/uL (ref 0.9–3.3)

## 2014-12-16 LAB — COMPREHENSIVE METABOLIC PANEL (CC13)
ALBUMIN: 4.1 g/dL (ref 3.5–5.0)
ALK PHOS: 56 U/L (ref 40–150)
ALT: 18 U/L (ref 0–55)
AST: 28 U/L (ref 5–34)
Anion Gap: 6 mEq/L (ref 3–11)
BILIRUBIN TOTAL: 0.7 mg/dL (ref 0.20–1.20)
BUN: 11.1 mg/dL (ref 7.0–26.0)
CALCIUM: 9 mg/dL (ref 8.4–10.4)
CHLORIDE: 104 meq/L (ref 98–109)
CO2: 29 meq/L (ref 22–29)
CREATININE: 1.4 mg/dL — AB (ref 0.7–1.3)
EGFR: 71 mL/min/{1.73_m2} — ABNORMAL LOW (ref >90)
Glucose: 101 mg/dl (ref 70–140)
Potassium: 4.4 mEq/L (ref 3.5–5.1)
SODIUM: 139 meq/L (ref 136–145)
TOTAL PROTEIN: 7.1 g/dL (ref 6.4–8.3)

## 2014-12-16 MED ORDER — CAPECITABINE 500 MG PO TABS: 2000.0000 mg | ORAL_TABLET | Freq: Two times a day (BID) | ORAL | Status: DC

## 2014-12-16 NOTE — Telephone Encounter (Signed)
Notified Darren Hill that Dr. Benay Spice wants him to start his next Xeloda cycle on 12/18/14 instead of 12/17/14. He understands and agrees. Also still needs note that he was in the office today. Will write on script for him and he will pick it up tomorrow at front desk.

## 2014-12-16 NOTE — Progress Notes (Signed)
Reports he completed his Xeloda on 12/10/14.

## 2014-12-16 NOTE — Progress Notes (Signed)
  [place] OFFICE PROGRESS NOTE   Diagnosis: Rectal cancer  INTERVAL HISTORY:   Mr. Darren Hill returns as scheduled. He began another cycle of Xeloda on 11/27/2014. He denies mouth sores, diarrhea, and hand/foot pain. He empties the ileostomy bag approximately 3 times in the morning and 3 times in the evening. He is working.  Objective:  Vital signs in last 24 hours:  Blood pressure 129/83, pulse 72, temperature 97.4 F (36.3 C), temperature source Oral, resp. rate 19, height $RemoveBe'5\' 9"'[id number]$  (1.753 m), weight 174 lb 4.8 oz (79.062 kg), SpO2 100 %.    HEENT: No thrush or ulcers Resp: Lungs with end inspiratory wheeze/rhonchi at the left upper posterior chest that cleared after several respirations, no respiratory distress Cardio: Regular rate and rhythm GI: No hepatomegaly, nontender, right lower quadrant ileostomy Vascular: No leg edema  Skin: Hyperpigmentation and skin thickening over the palms and soles without skin breakdown or erythema     Lab Results:  Lab Results  Component Value Date   WBC 3.7* 12/16/2014   HGB 13.2 12/16/2014   HCT 40.8 12/16/2014   MCV 92.9 12/16/2014   PLT 261 12/16/2014   NEUTROABS 1.7 12/16/2014    Medications: I have reviewed the patient's current medications.  Assessment/Plan: 1. Clinical stage II Rectal cancer, posterior rectal mass identified at 10-15 cm from the anal verge, a biopsy 03/31/2014 confirmed adenocarcinoma  Endoscopic ultrasound 04/03/2014 revealed a (uT3,uN0) lesion   Initiation of radiation/Xeloda 05/05/2014, completed 06/16/2014.  Low anterior resection with diverting loop ileostomy 08/29/2014. Pathology showed invasive adenocarcinoma arising in a background of tubular adenoma invading into but not through the muscularis propria. There was no evidence of angiolymphatic invasion or perineural invasion. There was one hyperplastic polyp with no adenomatous change or malignancy. One of 12 lymph nodes was positive for  metastatic carcinoma. Resection margins were negative for atypia or malignancy. Pathologic staging pT2, pN1a.   Microsatellite stable; preserved expression of the major and minor MMR proteins.  Cycle 1 adjuvant Xeloda 09/25/2014.  Cycle 2 adjuvant Xeloda 10/16/2014  Cycle 3 adjuvant Xeloda 11/06/2014  Cycle 4 adjuvant Xeloda 11/27/2014 2. Hypertension.   Disposition:  Darren Hill has completed 4 cycles of adjuvant Xeloda. He continues to tolerate the Xeloda well. The plan is to begin cycle 5 on 12/18/2014. He will return for an office visit on 01/20/2015. He will schedule a point with Dr. Johney [place] in the interim to plan the ileostomy takedown. Betsy Coder, MD  12/16/2014  10:50 AM

## 2014-12-16 NOTE — Telephone Encounter (Signed)
Gave avs & cal for Feb. °

## 2014-12-17 ENCOUNTER — Other Ambulatory Visit (INDEPENDENT_AMBULATORY_CARE_PROVIDER_SITE_OTHER): Payer: Self-pay | Admitting: Surgery

## 2014-12-30 ENCOUNTER — Other Ambulatory Visit (INDEPENDENT_AMBULATORY_CARE_PROVIDER_SITE_OTHER): Payer: Self-pay | Admitting: Surgery

## 2014-12-30 DIAGNOSIS — C20 Malignant neoplasm of rectum: Secondary | ICD-10-CM

## 2014-12-31 ENCOUNTER — Other Ambulatory Visit (INDEPENDENT_AMBULATORY_CARE_PROVIDER_SITE_OTHER): Payer: Self-pay | Admitting: Surgery

## 2015-01-06 ENCOUNTER — Ambulatory Visit: Payer: Commercial Managed Care - PPO | Admitting: Oncology

## 2015-01-06 ENCOUNTER — Ambulatory Visit (HOSPITAL_COMMUNITY)
Admission: RE | Admit: 2015-01-06 | Discharge: 2015-01-06 | Disposition: A | Discharge status: Home / Self Care | Payer: Commercial Managed Care - PPO | Source: Ambulatory Visit | Attending: Surgery | Admitting: Surgery

## 2015-01-06 ENCOUNTER — Other Ambulatory Visit: Payer: Commercial Managed Care - PPO

## 2015-01-06 ENCOUNTER — Encounter (INDEPENDENT_AMBULATORY_CARE_PROVIDER_SITE_OTHER): Payer: Self-pay

## 2015-01-06 DIAGNOSIS — C20 Malignant neoplasm of rectum: Secondary | ICD-10-CM | POA: Insufficient documentation

## 2015-01-06 DIAGNOSIS — Z01818 Encounter for other preprocedural examination: Secondary | ICD-10-CM | POA: Insufficient documentation

## 2015-01-06 MED ORDER — IOHEXOL 300 MG/ML  SOLN
450.0000 mL | Freq: Once | INTRAMUSCULAR | Status: AC | PRN
  Administered 2015-01-06: 450 mL

## 2015-01-20 ENCOUNTER — Other Ambulatory Visit (HOSPITAL_BASED_OUTPATIENT_CLINIC_OR_DEPARTMENT_OTHER): Payer: Commercial Managed Care - PPO

## 2015-01-20 ENCOUNTER — Telehealth: Payer: Self-pay | Admitting: Nurse Practitioner

## 2015-01-20 ENCOUNTER — Ambulatory Visit (HOSPITAL_BASED_OUTPATIENT_CLINIC_OR_DEPARTMENT_OTHER): Payer: Commercial Managed Care - PPO | Admitting: Nurse Practitioner

## 2015-01-20 VITALS — BP 119/83 | HR 84 | Temp 97.7°F | Resp 18 | Ht 69.0 in | Wt 176.2 lb

## 2015-01-20 DIAGNOSIS — C772 Secondary and unspecified malignant neoplasm of intra-abdominal lymph nodes: Secondary | ICD-10-CM

## 2015-01-20 DIAGNOSIS — C20 Malignant neoplasm of rectum: Secondary | ICD-10-CM

## 2015-01-20 DIAGNOSIS — I10 Essential (primary) hypertension: Secondary | ICD-10-CM

## 2015-01-20 LAB — CBC WITH DIFFERENTIAL/PLATELET
BASO%: 1.2 % (ref 0.0–2.0)
BASOS ABS: 0.1 10*3/uL (ref 0.0–0.1)
EOS ABS: 0.2 10*3/uL (ref 0.0–0.5)
EOS%: 3.8 % (ref 0.0–7.0)
HCT: 39.2 % (ref 38.4–49.9)
HGB: 13.6 g/dL (ref 13.0–17.1)
LYMPH#: 1.4 10*3/uL (ref 0.9–3.3)
LYMPH%: 34.4 % (ref 14.0–49.0)
MCH: 31.8 pg (ref 27.2–33.4)
MCHC: 34.7 g/dL (ref 32.0–36.0)
MCV: 91.6 fL (ref 79.3–98.0)
MONO#: 0.6 10*3/uL (ref 0.1–0.9)
MONO%: 13.6 % (ref 0.0–14.0)
NEUT#: 2 10*3/uL (ref 1.5–6.5)
NEUT%: 47 % (ref 39.0–75.0)
Platelets: 217 10*3/uL (ref 140–400)
RBC: 4.28 10*6/uL (ref 4.20–5.82)
RDW: 17.3 % — AB (ref 11.0–14.6)
WBC: 4.2 10*3/uL (ref 4.0–10.3)

## 2015-01-20 LAB — COMPREHENSIVE METABOLIC PANEL (CC13)
ALK PHOS: 76 U/L (ref 40–150)
ALT: 21 U/L (ref 0–55)
AST: 35 U/L — ABNORMAL HIGH (ref 5–34)
Albumin: 4 g/dL (ref 3.5–5.0)
Anion Gap: 8 mEq/L (ref 3–11)
BILIRUBIN TOTAL: 0.58 mg/dL (ref 0.20–1.20)
BUN: 10 mg/dL (ref 7.0–26.0)
CHLORIDE: 102 meq/L (ref 98–109)
CO2: 27 mEq/L (ref 22–29)
Calcium: 8.9 mg/dL (ref 8.4–10.4)
Creatinine: 1.3 mg/dL (ref 0.7–1.3)
EGFR: 73 mL/min/{1.73_m2} — ABNORMAL LOW (ref >90)
Glucose: 97 mg/dl (ref 70–140)
Potassium: 4 mEq/L (ref 3.5–5.1)
Sodium: 136 mEq/L (ref 136–145)
Total Protein: 7.1 g/dL (ref 6.4–8.3)

## 2015-01-20 NOTE — Telephone Encounter (Signed)
Gave avs & calendar for May. °

## 2015-01-20 NOTE — Progress Notes (Signed)
  [facility] OFFICE PROGRESS NOTE   Diagnosis: Rectal cancer   INTERVAL HISTORY:   Darren Hill returns as scheduled. He completed the fifth and final cycle of adjuvant Xeloda beginning 12/17/2014. He denies nausea/vomiting. No mouth ulcers. No diarrhea. No hand or foot pain or redness. He continues to have a good appetite and good energy level.  Objective:  Vital signs in last 24 hours:  Blood pressure 119/83, pulse 84, temperature 97.7 F (36.5 C), temperature source Oral, resp. rate 18, height _0  (1.753 m), weight 176 lb 3.2 oz (79.924 kg), SpO2 99 %.    HEENT: No thrush or ulcers. Resp: Lungs clear bilaterally. Cardio: Regular rate and rhythm. GI: Abdomen soft and nontender. No hepatomegaly. Right lower quadrant ileostomy. Vascular: No leg edema.  Skin: Palms with mild hyperpigmentation. No skin breakdown.    Lab Results:  Lab Results  Component Value Date   WBC 4.2 01/20/2015   HGB 13.6 01/20/2015   HCT 39.2 01/20/2015   MCV 91.6 01/20/2015   PLT 217 01/20/2015   NEUTROABS 2.0 01/20/2015    Imaging:  No results found.  Medications: I have reviewed the patient's current medications.  Assessment/Plan: 1. Clinical stage II Rectal cancer, posterior rectal mass identified at 10-15 cm from the anal verge, a biopsy 03/31/2014 confirmed adenocarcinoma  Endoscopic ultrasound 04/03/2014 revealed a (uT3,uN0) lesion   Initiation of radiation/Xeloda 05/05/2014, completed 06/16/2014.  Low anterior resection with diverting loop ileostomy 08/29/2014. Pathology showed invasive adenocarcinoma arising in a background of tubular adenoma invading into but not through the muscularis propria. There was no evidence of angiolymphatic invasion or perineural invasion. There was one hyperplastic polyp with no adenomatous change or malignancy. One of 12 lymph nodes was positive for metastatic carcinoma. Resection margins were negative for atypia or malignancy. Pathologic  staging pT2, pN1a.   Microsatellite stable; preserved expression of the major and minor MMR proteins.  Cycle 1 adjuvant Xeloda 09/25/2014.  Cycle 2 adjuvant Xeloda 10/16/2014  Cycle 3 adjuvant Xeloda 11/06/2014  Cycle 4 adjuvant Xeloda 11/27/2014  Cycle 5 adjuvant Xeloda 12/17/2014 2. Hypertension.   Disposition: Darren Hill appears stable. He has completed the course of adjuvant chemotherapy. He will return for a follow-up visit in May with a CEA and CT scans approximately 1 week prior.  He continues follow-up with Dr. Johney [place] regarding the ileostomy takedown.  Plan reviewed with Dr. Benay Spice.  Ned Card ANP/GNP-BC   01/20/2015  9:30 AM

## 2015-01-26 NOTE — Patient Instructions (Addendum)
Darren Hill  01/26/2015   Your procedure is scheduled on: 01/30/2015    Report to [facility] Main  Entrance and follow signs to               [place] at    [place] AM.  Call this number if you have problems the morning of surgery [phone]   Remember:  Do not eat food or drink liquids :After Midnight.     Take these medicines the morning of surgery with A SIP OF WATER: none                                You may not have any metal on your body including hair pins and              piercings  Do not wear jewelry,  lotions, powders or perfumes., deodorant.                          Men may shave face and neck.   Do not bring valuables to the hospital. [facility].  Contacts, dentures or bridgework may not be worn into surgery.  Leave suitcase in the car. After surgery it may be brought to your room.         Special Instructions: coughing and deep breathing exercises, leg exercises               Please read over the following fact sheets you were given: _____________________________________________________________________             [facility] - Preparing for Surgery Before surgery, you can play an important role.  Because skin is not sterile, your skin needs to be as free of germs as possible.  You can reduce the number of germs on your skin by washing with CHG (chlorahexidine gluconate) soap before surgery.  CHG is an antiseptic cleaner which kills germs and bonds with the skin to continue killing germs even after washing. Please DO NOT use if you have an allergy to CHG or antibacterial soaps.  If your skin becomes reddened/irritated stop using the CHG and inform your nurse when you arrive at Short Stay. Do not shave (including legs and underarms) for at least 48 hours prior to the first CHG shower.  You may shave your face/neck. Please follow these instructions carefully:  1.  Shower with CHG  Soap the night before surgery and the  morning of Surgery.  2.  If you choose to wash your hair, wash your hair first as usual with your  normal  shampoo.  3.  After you shampoo, rinse your hair and body thoroughly to remove the  shampoo.                           4.  Use CHG as you would any other liquid soap.  You can apply chg directly  to the skin and wash                       Gently with a scrungie or clean washcloth.  5.  Apply the CHG Soap to your body ONLY FROM THE NECK  DOWN.   Do not use on face/ open                           Wound or open sores. Avoid contact with eyes, ears mouth and genitals (private parts).                       Wash face,  Genitals (private parts) with your normal soap.             6.  Wash thoroughly, paying special attention to the area where your surgery  will be performed.  7.  Thoroughly rinse your body with warm water from the neck down.  8.  DO NOT shower/wash with your normal soap after using and rinsing off  the CHG Soap.                9.  Pat yourself dry with a clean towel.            10.  Wear clean pajamas.            11.  Place clean sheets on your bed the night of your first shower and do not  sleep with pets. Day of Surgery : Do not apply any lotions/deodorants the morning of surgery.  Please wear clean clothes to the hospital/surgery center.  FAILURE TO FOLLOW THESE INSTRUCTIONS MAY RESULT IN THE CANCELLATION OF YOUR SURGERY PATIENT SIGNATURE_________________________________  NURSE SIGNATURE__________________________________  ________________________________________________________________________  WHAT IS A BLOOD TRANSFUSION? Blood Transfusion Information  A transfusion is the replacement of blood or some of its parts. Blood is made up of multiple cells which provide different functions.  Red blood cells carry oxygen and are used for blood loss replacement.  White blood cells fight against infection.  Platelets control  bleeding.  Plasma helps clot blood.  Other blood products are available for specialized needs, such as hemophilia or other clotting disorders. BEFORE THE TRANSFUSION  Who gives blood for transfusions?   Healthy volunteers who are fully evaluated to make sure their blood is safe. This is blood bank blood. Transfusion therapy is the safest it has ever been in the practice of medicine. Before blood is taken from a donor, a complete history is taken to make sure that person has no history of diseases nor engages in risky social behavior (examples are intravenous drug use or sexual activity with multiple partners). The donor's travel history is screened to minimize risk of transmitting infections, such as malaria. The donated blood is tested for signs of infectious diseases, such as HIV and hepatitis. The blood is then tested to be sure it is compatible with you in order to minimize the chance of a transfusion reaction. If you or a relative donates blood, this is often done in anticipation of surgery and is not appropriate for emergency situations. It takes many days to process the donated blood. RISKS AND COMPLICATIONS Although transfusion therapy is very safe and saves many lives, the main dangers of transfusion include:   Getting an infectious disease.  Developing a transfusion reaction. This is an allergic reaction to something in the blood you were given. Every precaution is taken to prevent this. The decision to have a blood transfusion has been considered carefully by your caregiver before blood is given. Blood is not given unless the benefits outweigh the risks. AFTER THE TRANSFUSION  Right after receiving a blood transfusion, you will usually feel much better and more energetic.  This is especially true if your red blood cells have gotten low (anemic). The transfusion raises the level of the red blood cells which carry oxygen, and this usually causes an energy increase.  The nurse  administering the transfusion will monitor you carefully for complications. HOME CARE INSTRUCTIONS  No special instructions are needed after a transfusion. You may find your energy is better. Speak with your caregiver about any limitations on activity for underlying diseases you may have. SEEK MEDICAL CARE IF:   Your condition is not improving after your transfusion.  You develop redness or irritation at the intravenous (IV) site. SEEK IMMEDIATE MEDICAL CARE IF:  Any of the following symptoms occur over the next 12 hours:  Shaking chills.  You have a temperature by mouth above 102 F (38.9 C), not controlled by medicine.  Chest, back, or muscle pain.  People around you feel you are not acting correctly or are confused.  Shortness of breath or difficulty breathing.  Dizziness and fainting.  You get a rash or develop hives.  You have a decrease in urine output.  Your urine turns a dark color or changes to pink, red, or brown. Any of the following symptoms occur over the next 10 days:  You have a temperature by mouth above 102 F (38.9 C), not controlled by medicine.  Shortness of breath.  Weakness after normal activity.  The white part of the eye turns yellow (jaundice).  You have a decrease in the amount of urine or are urinating less often.  Your urine turns a dark color or changes to pink, red, or brown. Document Released: 11/11/2000 Document Revised: 02/06/2012 Document Reviewed: 06/30/2008 [facility] Patient Information 2014 [place], [place].  _______________________________________________________________________

## 2015-01-28 ENCOUNTER — Encounter (HOSPITAL_COMMUNITY): Payer: Self-pay

## 2015-01-28 ENCOUNTER — Encounter (HOSPITAL_COMMUNITY)
Admission: RE | Admit: 2015-01-28 | Discharge: 2015-01-28 | Disposition: A | Discharge status: Home / Self Care | Payer: Commercial Managed Care - PPO | Source: Ambulatory Visit | Attending: Surgery | Admitting: Surgery

## 2015-01-28 HISTORY — DX: Gastro-esophageal reflux disease without esophagitis: K21.9

## 2015-01-28 NOTE — Progress Notes (Signed)
Labs from 01/20/14 in EPIC of CMP and CBC/DIFF.  Patient completed chemotherapy approximately 01/22/2015.   EKG and CXR 08/25/2014 in EPIC.

## 2015-01-30 ENCOUNTER — Inpatient Hospital Stay (HOSPITAL_COMMUNITY)
Admission: RE | Admit: 2015-01-30 | Discharge: 2015-02-02 | DRG: 345 | Disposition: A | Discharge status: Home Health | Payer: Commercial Managed Care - PPO | Source: Ambulatory Visit | Attending: Surgery | Admitting: Surgery

## 2015-01-30 ENCOUNTER — Encounter (HOSPITAL_COMMUNITY): Payer: Self-pay | Admitting: *Deleted

## 2015-01-30 ENCOUNTER — Encounter (HOSPITAL_COMMUNITY)
Admission: RE | Disposition: A | Discharge status: Home Health | Payer: Self-pay | Source: Ambulatory Visit | Attending: Surgery

## 2015-01-30 ENCOUNTER — Inpatient Hospital Stay (HOSPITAL_COMMUNITY): Payer: Commercial Managed Care - PPO | Admitting: Anesthesiology

## 2015-01-30 DIAGNOSIS — Z932 Ileostomy status: Secondary | ICD-10-CM

## 2015-01-30 DIAGNOSIS — Z87891 Personal history of nicotine dependence: Secondary | ICD-10-CM | POA: Diagnosis not present

## 2015-01-30 DIAGNOSIS — Z432 Encounter for attention to ileostomy: Principal | ICD-10-CM

## 2015-01-30 DIAGNOSIS — Z923 Personal history of irradiation: Secondary | ICD-10-CM | POA: Diagnosis not present

## 2015-01-30 DIAGNOSIS — I10 Essential (primary) hypertension: Secondary | ICD-10-CM | POA: Diagnosis present

## 2015-01-30 DIAGNOSIS — C20 Malignant neoplasm of rectum: Secondary | ICD-10-CM | POA: Diagnosis present

## 2015-01-30 DIAGNOSIS — Z9049 Acquired absence of other specified parts of digestive tract: Secondary | ICD-10-CM | POA: Diagnosis present

## 2015-01-30 DIAGNOSIS — Z9221 Personal history of antineoplastic chemotherapy: Secondary | ICD-10-CM | POA: Diagnosis not present

## 2015-01-30 DIAGNOSIS — D62 Acute posthemorrhagic anemia: Secondary | ICD-10-CM | POA: Diagnosis present

## 2015-01-30 DIAGNOSIS — K219 Gastro-esophageal reflux disease without esophagitis: Secondary | ICD-10-CM | POA: Diagnosis present

## 2015-01-30 DIAGNOSIS — Z79899 Other long term (current) drug therapy: Secondary | ICD-10-CM | POA: Diagnosis not present

## 2015-01-30 DIAGNOSIS — C772 Secondary and unspecified malignant neoplasm of intra-abdominal lymph nodes: Secondary | ICD-10-CM

## 2015-01-30 HISTORY — PX: ILEOSTOMY CLOSURE: SHX1784

## 2015-01-30 LAB — TYPE AND SCREEN
ABO/RH(D): O POS
Antibody Screen: NEGATIVE

## 2015-01-30 SURGERY — CLOSURE, ILEOSTOMY
Anesthesia: General | Site: Abdomen

## 2015-01-30 MED ORDER — DEXAMETHASONE SODIUM PHOSPHATE 10 MG/ML IJ SOLN
INTRAMUSCULAR | Status: AC
  Filled 2015-01-30: qty 1

## 2015-01-30 MED ORDER — ONDANSETRON HCL 4 MG/2ML IJ SOLN: 4.0000 mg | Freq: Four times a day (QID) | INTRAMUSCULAR | Status: DC | PRN

## 2015-01-30 MED ORDER — MIDAZOLAM HCL 2 MG/2ML IJ SOLN
INTRAMUSCULAR | Status: AC
  Filled 2015-01-30: qty 2

## 2015-01-30 MED ORDER — LACTATED RINGERS IV BOLUS (SEPSIS): 1000.0000 mL | Freq: Three times a day (TID) | INTRAVENOUS | Status: AC | PRN

## 2015-01-30 MED ORDER — FENTANYL CITRATE 0.05 MG/ML IJ SOLN
INTRAMUSCULAR | Status: AC
  Filled 2015-01-30: qty 5

## 2015-01-30 MED ORDER — DIPHENHYDRAMINE HCL 12.5 MG/5ML PO ELIX: 12.5000 mg | ORAL_SOLUTION | Freq: Four times a day (QID) | ORAL | Status: DC | PRN

## 2015-01-30 MED ORDER — ALVIMOPAN 12 MG PO CAPS
12.0000 mg | ORAL_CAPSULE | Freq: Two times a day (BID) | ORAL | Status: DC
  Administered 2015-01-31 – 2015-02-01 (×4): 12 mg via ORAL
  Filled 2015-01-30 (×6): qty 1

## 2015-01-30 MED ORDER — LACTATED RINGERS IV SOLN
INTRAVENOUS | Status: DC
  Administered 2015-01-30: 12:00:00 via INTRAVENOUS
  Administered 2015-01-30: 1000 mL via INTRAVENOUS

## 2015-01-30 MED ORDER — ACETAMINOPHEN 500 MG PO TABS
1000.0000 mg | ORAL_TABLET | Freq: Three times a day (TID) | ORAL | Status: DC
  Administered 2015-01-30 – 2015-02-02 (×9): 1000 mg via ORAL
  Filled 2015-01-30 (×9): qty 2

## 2015-01-30 MED ORDER — CEFOTETAN DISODIUM 2 G IJ SOLR
2.0000 g | Freq: Two times a day (BID) | INTRAMUSCULAR | Status: AC
  Administered 2015-01-30: 2 g via INTRAVENOUS
  Filled 2015-01-30: qty 2

## 2015-01-30 MED ORDER — DIPHENHYDRAMINE HCL 50 MG/ML IJ SOLN: 12.5000 mg | Freq: Four times a day (QID) | INTRAMUSCULAR | Status: DC | PRN

## 2015-01-30 MED ORDER — MAGIC MOUTHWASH
15.0000 mL | Freq: Four times a day (QID) | ORAL | Status: DC | PRN
  Filled 2015-01-30: qty 15

## 2015-01-30 MED ORDER — HEPARIN SODIUM (PORCINE) 5000 UNIT/ML IJ SOLN
5000.0000 [IU] | Freq: Three times a day (TID) | INTRAMUSCULAR | Status: DC
  Administered 2015-01-31 – 2015-02-02 (×7): 5000 [IU] via SUBCUTANEOUS
  Filled 2015-01-30 (×10): qty 1

## 2015-01-30 MED ORDER — HYDROMORPHONE HCL 1 MG/ML IJ SOLN
0.5000 mg | INTRAMUSCULAR | Status: DC | PRN
  Administered 2015-01-30 (×2): 1 mg via INTRAVENOUS
  Administered 2015-01-30: 2 mg via INTRAVENOUS
  Administered 2015-01-31: 1 mg via INTRAVENOUS
  Administered 2015-01-31 – 2015-02-01 (×5): 2 mg via INTRAVENOUS
  Filled 2015-01-30: qty 1
  Filled 2015-01-30: qty 2
  Filled 2015-01-30: qty 1
  Filled 2015-01-30 (×2): qty 2
  Filled 2015-01-30: qty 1
  Filled 2015-01-30 (×3): qty 2

## 2015-01-30 MED ORDER — FENTANYL CITRATE 0.05 MG/ML IJ SOLN
INTRAMUSCULAR | Status: AC
  Filled 2015-01-30: qty 2

## 2015-01-30 MED ORDER — NEOSTIGMINE METHYLSULFATE 10 MG/10ML IV SOLN
INTRAVENOUS | Status: DC | PRN
  Administered 2015-01-30: 4 mg via INTRAVENOUS

## 2015-01-30 MED ORDER — SODIUM CHLORIDE 0.9 % IV SOLN
INTRAVENOUS | Status: DC
  Administered 2015-01-30: 22:00:00 via INTRAVENOUS
  Administered 2015-01-31: 75 mL/h via INTRAVENOUS
  Administered 2015-01-31: 21:00:00 via INTRAVENOUS

## 2015-01-30 MED ORDER — HYDROMORPHONE HCL 1 MG/ML IJ SOLN: 0.2500 mg | INTRAMUSCULAR | Status: DC | PRN

## 2015-01-30 MED ORDER — SACCHAROMYCES BOULARDII 250 MG PO CAPS
250.0000 mg | ORAL_CAPSULE | Freq: Two times a day (BID) | ORAL | Status: DC
  Administered 2015-01-30 – 2015-02-02 (×6): 250 mg via ORAL
  Filled 2015-01-30 (×7): qty 1

## 2015-01-30 MED ORDER — PROPOFOL 10 MG/ML IV BOLUS
INTRAVENOUS | Status: AC
  Filled 2015-01-30: qty 20

## 2015-01-30 MED ORDER — ESMOLOL HCL 10 MG/ML IV SOLN
INTRAVENOUS | Status: DC | PRN
  Administered 2015-01-30: 30 mg via INTRAVENOUS

## 2015-01-30 MED ORDER — BUPIVACAINE-EPINEPHRINE 0.25% -1:200000 IJ SOLN
INTRAMUSCULAR | Status: DC | PRN
  Administered 2015-01-30: 75 mL

## 2015-01-30 MED ORDER — NEOSTIGMINE METHYLSULFATE 10 MG/10ML IV SOLN
INTRAVENOUS | Status: AC
  Filled 2015-01-30: qty 1

## 2015-01-30 MED ORDER — ROCURONIUM BROMIDE 100 MG/10ML IV SOLN
INTRAVENOUS | Status: DC | PRN
  Administered 2015-01-30: 50 mg via INTRAVENOUS
  Administered 2015-01-30 (×2): 20 mg via INTRAVENOUS

## 2015-01-30 MED ORDER — ALVIMOPAN 12 MG PO CAPS
12.0000 mg | ORAL_CAPSULE | Freq: Once | ORAL | Status: AC
  Administered 2015-01-30: 12 mg via ORAL
  Filled 2015-01-30: qty 1

## 2015-01-30 MED ORDER — HEPARIN SODIUM (PORCINE) 5000 UNIT/ML IJ SOLN
5000.0000 [IU] | Freq: Once | INTRAMUSCULAR | Status: AC
  Administered 2015-01-30: 5000 [IU] via SUBCUTANEOUS
  Filled 2015-01-30: qty 1

## 2015-01-30 MED ORDER — DEXAMETHASONE SODIUM PHOSPHATE 10 MG/ML IJ SOLN
INTRAMUSCULAR | Status: DC | PRN
  Administered 2015-01-30: 10 mg via INTRAVENOUS

## 2015-01-30 MED ORDER — LABETALOL HCL 5 MG/ML IV SOLN
INTRAVENOUS | Status: AC
  Administered 2015-01-30: 5 mg
  Filled 2015-01-30: qty 4

## 2015-01-30 MED ORDER — HYDROMORPHONE HCL 2 MG/ML IJ SOLN
INTRAMUSCULAR | Status: AC
  Filled 2015-01-30: qty 1

## 2015-01-30 MED ORDER — ESMOLOL HCL 10 MG/ML IV SOLN
INTRAVENOUS | Status: AC
  Filled 2015-01-30: qty 10

## 2015-01-30 MED ORDER — METOPROLOL TARTRATE 1 MG/ML IV SOLN
5.0000 mg | Freq: Four times a day (QID) | INTRAVENOUS | Status: DC
  Administered 2015-01-30 – 2015-02-02 (×6): 5 mg via INTRAVENOUS
  Filled 2015-01-30 (×15): qty 5

## 2015-01-30 MED ORDER — LISINOPRIL 5 MG PO TABS
5.0000 mg | ORAL_TABLET | Freq: Every morning | ORAL | Status: DC
  Administered 2015-01-31 – 2015-02-02 (×3): 5 mg via ORAL
  Filled 2015-01-30 (×3): qty 1

## 2015-01-30 MED ORDER — ONDANSETRON HCL 4 MG/2ML IJ SOLN
INTRAMUSCULAR | Status: DC | PRN
  Administered 2015-01-30: 4 mg via INTRAVENOUS

## 2015-01-30 MED ORDER — MIDAZOLAM HCL 5 MG/5ML IJ SOLN
INTRAMUSCULAR | Status: DC | PRN
  Administered 2015-01-30: 2 mg via INTRAVENOUS

## 2015-01-30 MED ORDER — FENTANYL CITRATE 0.05 MG/ML IJ SOLN
INTRAMUSCULAR | Status: DC | PRN
  Administered 2015-01-30 (×2): 100 ug via INTRAVENOUS
  Administered 2015-01-30: 50 ug via INTRAVENOUS
  Administered 2015-01-30: 100 ug via INTRAVENOUS

## 2015-01-30 MED ORDER — OXYCODONE HCL 5 MG PO TABS: 5.0000 mg | ORAL_TABLET | ORAL | Status: DC | PRN

## 2015-01-30 MED ORDER — GLYCOPYRROLATE 0.2 MG/ML IJ SOLN
INTRAMUSCULAR | Status: DC | PRN
  Administered 2015-01-30: 0.6 mg via INTRAVENOUS

## 2015-01-30 MED ORDER — LIDOCAINE HCL (CARDIAC) 20 MG/ML IV SOLN
INTRAVENOUS | Status: AC
  Filled 2015-01-30: qty 5

## 2015-01-30 MED ORDER — ROCURONIUM BROMIDE 100 MG/10ML IV SOLN
INTRAVENOUS | Status: AC
  Filled 2015-01-30: qty 1

## 2015-01-30 MED ORDER — LACTATED RINGERS IV SOLN: INTRAVENOUS | Status: DC | PRN

## 2015-01-30 MED ORDER — KETOROLAC TROMETHAMINE 30 MG/ML IJ SOLN
INTRAMUSCULAR | Status: DC | PRN
  Administered 2015-01-30: 30 mg via INTRAVENOUS

## 2015-01-30 MED ORDER — PROPOFOL 10 MG/ML IV BOLUS
INTRAVENOUS | Status: DC | PRN
  Administered 2015-01-30: 200 mg via INTRAVENOUS

## 2015-01-30 MED ORDER — HYDROMORPHONE HCL 1 MG/ML IJ SOLN
INTRAMUSCULAR | Status: DC | PRN
  Administered 2015-01-30 (×2): 1 mg via INTRAVENOUS

## 2015-01-30 MED ORDER — GLYCOPYRROLATE 0.2 MG/ML IJ SOLN
INTRAMUSCULAR | Status: AC
  Filled 2015-01-30: qty 3

## 2015-01-30 MED ORDER — CHLORHEXIDINE GLUCONATE 4 % EX LIQD: 60.0000 mL | Freq: Once | CUTANEOUS | Status: DC

## 2015-01-30 MED ORDER — ONDANSETRON HCL 4 MG PO TABS: 4.0000 mg | ORAL_TABLET | Freq: Four times a day (QID) | ORAL | Status: DC | PRN

## 2015-01-30 MED ORDER — PHENOL 1.4 % MT LIQD
2.0000 | OROMUCOSAL | Status: DC | PRN
  Filled 2015-01-30: qty 177

## 2015-01-30 MED ORDER — LIDOCAINE HCL (CARDIAC) 20 MG/ML IV SOLN
INTRAVENOUS | Status: DC | PRN
  Administered 2015-01-30: 80 mg via INTRAVENOUS

## 2015-01-30 MED ORDER — BUPIVACAINE-EPINEPHRINE 0.25% -1:200000 IJ SOLN
INTRAMUSCULAR | Status: AC
  Filled 2015-01-30: qty 2

## 2015-01-30 MED ORDER — LIP MEDEX EX OINT
1.0000 "application " | TOPICAL_OINTMENT | Freq: Two times a day (BID) | CUTANEOUS | Status: DC
  Administered 2015-01-30 – 2015-02-02 (×5): 1 via TOPICAL
  Filled 2015-01-30: qty 7

## 2015-01-30 MED ORDER — ONDANSETRON HCL 4 MG/2ML IJ SOLN
INTRAMUSCULAR | Status: AC
  Filled 2015-01-30: qty 2

## 2015-01-30 MED ORDER — DEXTROSE 5 % IV SOLN
2.0000 g | INTRAVENOUS | Status: AC
  Administered 2015-01-30: 2 g via INTRAVENOUS
  Filled 2015-01-30: qty 2

## 2015-01-30 MED ORDER — ALUM & MAG HYDROXIDE-SIMETH 200-200-20 MG/5ML PO SUSP
30.0000 mL | Freq: Four times a day (QID) | ORAL | Status: DC | PRN
  Administered 2015-01-31: 30 mL via ORAL
  Filled 2015-01-30: qty 30

## 2015-01-30 MED ORDER — PROMETHAZINE HCL 25 MG/ML IJ SOLN: 6.2500 mg | INTRAMUSCULAR | Status: DC | PRN

## 2015-01-30 MED ORDER — 0.9 % SODIUM CHLORIDE (POUR BTL) OPTIME
TOPICAL | Status: DC | PRN
  Administered 2015-01-30: 2000 mL

## 2015-01-30 MED ORDER — MENTHOL 3 MG MT LOZG
1.0000 | LOZENGE | OROMUCOSAL | Status: DC | PRN
  Filled 2015-01-30: qty 9

## 2015-01-30 MED ORDER — CEFOTETAN DISODIUM-DEXTROSE 2-2.08 GM-% IV SOLR
INTRAVENOUS | Status: AC
  Filled 2015-01-30: qty 50

## 2015-01-30 SURGICAL SUPPLY — 51 items
BLADE EXTENDED COATED 6.5IN (ELECTRODE) IMPLANT
BLADE HEX COATED 2.75 (ELECTRODE) ×4 IMPLANT
BLADE SURG SZ10 CARB STEEL (BLADE) ×2 IMPLANT
CATH KIT ON-Q SILVERSOAK 7.5IN (CATHETERS) IMPLANT
COUNTER NEEDLE 20 DBL MAG RED (NEEDLE) ×2 IMPLANT
COVER MAYO STAND STRL (DRAPES) ×4 IMPLANT
DRAIN CHANNEL 19F RND (DRAIN) IMPLANT
DRAPE LAPAROSCOPIC ABDOMINAL (DRAPES) ×2 IMPLANT
DRAPE SHEET LG 3/4 BI-LAMINATE (DRAPES) ×2 IMPLANT
DRAPE UTILITY XL STRL (DRAPES) ×4 IMPLANT
DRAPE WARM FLUID 44X44 (DRAPE) ×2 IMPLANT
DRSG OPSITE POSTOP 4X10 (GAUZE/BANDAGES/DRESSINGS) IMPLANT
DRSG OPSITE POSTOP 4X6 (GAUZE/BANDAGES/DRESSINGS) IMPLANT
DRSG OPSITE POSTOP 4X8 (GAUZE/BANDAGES/DRESSINGS) IMPLANT
ELECT REM PT RETURN 9FT ADLT (ELECTROSURGICAL) ×2
ELECTRODE REM PT RTRN 9FT ADLT (ELECTROSURGICAL) ×1 IMPLANT
GAUZE SPONGE 4X4 12PLY STRL (GAUZE/BANDAGES/DRESSINGS) ×2 IMPLANT
GLOVE ECLIPSE 8.0 STRL XLNG CF (GLOVE) ×4 IMPLANT
GLOVE INDICATOR 8.0 STRL GRN (GLOVE) ×4 IMPLANT
GOWN STRL REUS W/TWL XL LVL3 (GOWN DISPOSABLE) ×8 IMPLANT
KIT BASIN OR (CUSTOM PROCEDURE TRAY) ×2 IMPLANT
LEGGING LITHOTOMY PAIR STRL (DRAPES) ×2 IMPLANT
LIGASURE IMPACT 36 18CM CVD LR (INSTRUMENTS) IMPLANT
PACK GENERAL/GYN (CUSTOM PROCEDURE TRAY) ×2 IMPLANT
PENCIL BUTTON HOLSTER BLD 10FT (ELECTRODE) ×2 IMPLANT
STAPLER GUN LINEAR PROX 60 (STAPLE) ×2 IMPLANT
STAPLER PROXIMATE 75MM BLUE (STAPLE) ×2 IMPLANT
STAPLER VISISTAT 35W (STAPLE) ×2 IMPLANT
SUCTION POOLE TIP (SUCTIONS) ×2 IMPLANT
SUT MNCRL AB 4-0 PS2 18 (SUTURE) ×2 IMPLANT
SUT NOV 1 T60/GS (SUTURE) IMPLANT
SUT NOVA NAB DX-16 0-1 5-0 T12 (SUTURE) IMPLANT
SUT NOVA T20/GS 25 (SUTURE) IMPLANT
SUT PDS AB 1 CTX 36 (SUTURE) ×4 IMPLANT
SUT SILK 2 0 (SUTURE) ×1
SUT SILK 2 0SH CR/8 30 (SUTURE) ×2 IMPLANT
SUT SILK 2-0 18XBRD TIE 12 (SUTURE) ×1 IMPLANT
SUT SILK 3 0 (SUTURE) ×1
SUT SILK 3 0 SH CR/8 (SUTURE) ×2 IMPLANT
SUT SILK 3-0 18XBRD TIE 12 (SUTURE) ×1 IMPLANT
SUT VIC AB 2-0 SH 18 (SUTURE) ×2 IMPLANT
SUT VIC AB 3-0 SH 18 (SUTURE) ×2 IMPLANT
SUT VICRYL 0 UR6 27IN ABS (SUTURE) ×2 IMPLANT
SUT VICRYL 2 0 18  UND BR (SUTURE) ×1
SUT VICRYL 2 0 18 UND BR (SUTURE) ×1 IMPLANT
TAPE UMBILICAL COTTON 1/8X30 (MISCELLANEOUS) ×2 IMPLANT
TOWEL OR 17X26 10 PK STRL BLUE (TOWEL DISPOSABLE) ×4 IMPLANT
TOWEL OR NON WOVEN STRL DISP B (DISPOSABLE) ×4 IMPLANT
TRAY FOLEY CATH 14FRSI W/METER (CATHETERS) ×2 IMPLANT
TUNNELER SHEATH ON-Q 16GX12 DP (PAIN MANAGEMENT) IMPLANT
YANKAUER SUCT BULB TIP 10FT TU (MISCELLANEOUS) ×2 IMPLANT

## 2015-01-30 NOTE — Interval H&P Note (Signed)
History and Physical Interval Note:  01/30/2015 11:03 AM  Darren Hill  has presented today for surgery, with the diagnosis of Loop Ileostomy in place for fecal diversion  The various methods of treatment have been discussed with the patient and family. After consideration of risks, benefits and other options for treatment, the patient has consented to  Procedure(s): TAKEDOWN OF LOOP ILEOSTOMY, EXAM UNDER ANESTHESIA (N/A) as a surgical intervention .  The patient's history has been reviewed, patient examined, no change in status, stable for surgery.  I have reviewed the patient's chart and labs.  Questions were answered to the patient's satisfaction.     Regine Christian C.

## 2015-01-30 NOTE — Transfer of Care (Signed)
Immediate Anesthesia Transfer of Care Note  Patient: Darren Hill  Procedure(s) Performed: Procedure(s): TAKEDOWN OF LOOP ILEOSTOMY, EXAM UNDER ANESTHESIA (N/A)  Patient Location: PACU  Anesthesia Type:General  Level of Consciousness: awake, alert  and oriented  Airway & Oxygen Therapy: Patient Spontanous Breathing and Patient connected to face mask oxygen  Post-op Assessment: Report given to RN and Post -op Vital signs reviewed and stable  Post vital signs: Reviewed and stable  Last Vitals:  Filed Vitals:   01/30/15 0857  BP: 129/80  Pulse: 85  Temp: 36.3 C  Resp: 20    Complications: No apparent anesthesia complications

## 2015-01-30 NOTE — Progress Notes (Signed)
Dr. Johney [place] made aware of continued red drainage from incision. Darren Hill pressure continues with large ice pack.

## 2015-01-30 NOTE — Progress Notes (Signed)
Increased amt red drainage and clotted material on ABD pad and ice pack after manuel pressure applied x 20 minutes. Oozing noted from lower portion and upper portion of dressing. HR slowly increasing. Area immediately around incision swelling. Dr. Johney [place] paged.

## 2015-01-30 NOTE — Anesthesia Preprocedure Evaluation (Addendum)
Anesthesia Evaluation  Patient identified by MRN, date of birth, ID band Patient awake    Reviewed: Allergy & Precautions, NPO status , Patient's Chart, lab work & pertinent test results  Airway Mallampati: II  TM Distance: >3 FB Neck ROM: Full    Dental no notable dental hx.    Pulmonary former smoker,  breath sounds clear to auscultation  Pulmonary exam normal       Cardiovascular Exercise Tolerance: Good hypertension, Pt. on medications Rhythm:Regular Rate:Normal     Neuro/Psych negative neurological ROS  negative psych ROS   GI/Hepatic Neg liver ROS, GERD-  ,  Endo/Other  negative endocrine ROS  Renal/GU negative Renal ROS  negative genitourinary   Musculoskeletal negative musculoskeletal ROS (+)   Abdominal   Peds negative pediatric ROS (+)  Hematology negative hematology ROS (+)   Anesthesia Other Findings   Reproductive/Obstetrics negative OB ROS                            Anesthesia Physical Anesthesia Plan  ASA: II  Anesthesia Plan: General   Post-op Pain Management:    Induction: Intravenous  Airway Management Planned: Oral ETT  Additional Equipment:   Intra-op Plan:   Post-operative Plan: Extubation in OR  Informed Consent: I have reviewed the patients History and Physical, chart, labs and discussed the procedure including the risks, benefits and alternatives for the proposed anesthesia with the patient or authorized representative who has indicated his/her understanding and acceptance.   Dental advisory given  Plan Discussed with: CRNA  Anesthesia Plan Comments:         Anesthesia Quick Evaluation

## 2015-01-30 NOTE — Progress Notes (Signed)
Darren Hill  Nov 02, 1966 [id number]  Patient Care Team: Ladell Pier, MD as PCP - General (Oncology) Ladell Pier, MD as Consulting Physician (Oncology) Jodelle Klaryssa Fauth, MD as Consulting Physician (Radiation Oncology) Missy Sabins, MD as Consulting Physician (Gastroenterology) Michael Boston, MD as Consulting Physician (General Surgery) Josue Hector, MD as Consulting Physician (Cardiology)  Called by recovery room.  Concern for blood on dressing.  Moderate volume.  Dressing removed.  Pressure held.  Bleeding slowed down.  Ice pack and pressure held for another 20 minutes.  Bleeding stopped.  No deep hematoma.  Most likely dermal skin bleeder.  Patient with some soreness at the incision but no major tenderness.  No peritonitis.  Fresh pressure dressing placed.  Patient with pain controlled.  Mild hypertension improve with blood pressure medication.  We will continue to monitor.  Patient Active Problem List   Diagnosis Date Noted  . Fecal diversion s/p ileostomy takedown 01/30/2015 01/30/2015  . Hypertension   . GERD (gastroesophageal reflux disease)   . Rectal cancer [id number] - s/p LAR resection 08/29/2014 04/10/2014  . ABNORMAL ELECTROCARDIOGRAM 06/28/2010    Past Medical History  Diagnosis Date  . Hypertension   . Colon cancer 04/03/14    rectal mass, colon ca  . Hx of radiation therapy 05/06/14-06/16/14    rectum  . CHEST PAIN 06/28/2010    Qualifier: Diagnosis of  By: Johnsie Cancel, MD, Rona Ravens   . Palpitations 06/28/2010    Qualifier: Diagnosis of  By: Johnsie Cancel, MD, Rona Ravens   . GERD (gastroesophageal reflux disease)     Past Surgical History  Procedure Laterality Date  . Eus N/A 04/03/2014    Procedure: LOWER ENDOSCOPIC ULTRASOUND (EUS);  Surgeon: Arta Silence, MD;  Location: [facility] ENDOSCOPY;  Service: Endoscopy;  Laterality: N/A;  H/P in file cabinet   . Colon biopsy N/A 04/03/14    rectal mass=adenocarcinoma  . Diverting ileostomy  08/29/14    History    Social History  . Marital Status: Married    Spouse Name: N/A  . Number of Children: 2  . Years of Education: N/A   Occupational History  . TRUCK DRIVER    Social History Main Topics  . Smoking status: Former Smoker -- 0.25 packs/day for 15 years    Types: Cigarettes    Quit date: 04/03/2006  . Smokeless tobacco: Never Used  . Alcohol Use: No  . Drug Use: No  . Sexual Activity: Yes   Other Topics Concern  . Not on file   Social History Narrative    Family History  Problem Relation Age of Onset  . Brain cancer Maternal Aunt     not sure if tumor did operation on brain    Current Facility-Administered Medications  Medication Dose Route Frequency Provider Last Rate Last Dose  . 0.9 %  sodium chloride infusion   Intravenous Continuous Michael Boston, MD      . chlorhexidine (HIBICLENS) 4 % liquid 4 application  60 mL Topical Once Michael Boston, MD         No Known Allergies  BP 143/89 mmHg  Pulse 84  Temp(Src) 98.1 F (36.7 C) (Oral)  Resp 11  Ht 5\' 9"  (1.753 m)  Wt 174 lb (78.926 kg)  BMI 25.68 kg/m2  SpO2 100%  Dg Colon W/cm - Wo/w Kub  01/06/2015   CLINICAL DATA:  Rectal cancer. Preoperative evaluation for ileostomy takedown.  EXAM: WATER SOLUBLE CONTRAST ENEMA  TECHNIQUE: The entire colon was  filled in a retrograde manner with water-soluble contrast.  FLUOROSCOPY TIME:  Fluoroscopy Time:  1 min 48 seconds  Number of Acquired Images:  16  COMPARISON:  CT scan dated 04/04/2014  FINDINGS: The rectal anastomosis is widely patent with no evidence of leakage or other abnormality. No stricture. The remaining portion of the colon was filled without reflux into the terminal ileum. Stricture. There are retained secretions in the colon.  IMPRESSION: Widely patent rectal anastomosis with no evidence of extravasation or other significant abnormality.   Electronically Signed   By: Lorriane Shire M.D.   On: 01/06/2015 10:53    Note: This dictation was prepared with  Dragon/digital dictation along with [employer]. Any transcriptional errors that result from this process are unintentional.

## 2015-01-30 NOTE — Progress Notes (Signed)
Dr. Delma Post in and aware of Hr greater than 100 and BP 161/97. Orders given.

## 2015-01-30 NOTE — Op Note (Signed)
01/30/2015  1:53 PM  PATIENT:  Darren Hill  49 y.o. male  Patient Care Team: Ladell Pier, MD as PCP - General (Oncology) Ladell Pier, MD as Consulting Physician (Oncology) Jodelle Tylerjames Hoglund, MD as Consulting Physician (Radiation Oncology) Missy Sabins, MD as Consulting Physician (Gastroenterology) Michael Boston, MD as Consulting Physician (General Surgery) Josue Hector, MD as Consulting Physician (Cardiology)  PRE-OPERATIVE DIAGNOSIS:  Loop Ileostomy in place for fecal diversion  POST-OPERATIVE DIAGNOSIS:  Loop Ileostomy in place for fecal diversion  PROCEDURE:    TAKEDOWN OF LOOP ILEOSTOMY EXAM UNDER ANESTHESIA  SURGEON:  Surgeon(s): Michael Boston, MD Leighton Ruff, MD - Assist  ANESTHESIA:   local and general  EBL:  Total I/O In: 71 [I.V.:1650; IV Piggyback:50] Out: 60 [Urine:75]  Delay start of Pharmacological VTE agent (>24hrs) due to surgical blood loss or risk of bleeding:  no  DRAINS: none   SPECIMEN:  Source of Specimen:  loop ileostomy  DISPOSITION OF SPECIMEN:  PATHOLOGY  COUNTS:  YES  PLAN OF CARE: Admit to inpatient   PATIENT DISPOSITION:  PACU - hemodynamically stable.  INDICATION: Pleasant patient status post low anterior resection with diverting loop ileostomy to protect the very distal anastomosis.  The patient has recovered from that surgery with the anastomosis well-healed.  It was felt safe to have the loop ileostomy taken down.  I discussed the procedure with the patient:  The anatomy & physiology of the digestive tract was discussed.  The pathophysiology was discussed.  Possibility of remaining with an ostomy permanently was discussed.  I offered ostomy takedown.  Laparoscopic & open techniques were discussed.   Risks such as bleeding, infection, abscess, leak, reoperation, possible re-ostomy, injury to other organs, hernia, heart attack, death, and other risks were discussed.   I noted a good likelihood this will help address the problem.   Goals of post-operative recovery were discussed as well.  We will work to minimize complications.  Questions were answered.  The patient expresses understanding & wishes to proceed with surgery.  OR FINDINGS: Moderate adhesions to the ileostomy in the abdominal wall.  Less so in the abdomen.  The anastomosis 4 cm from the anal verge with minimal narrowing.  DESCRIPTION:   Informed consent was confirmed.   The patient received IV antibiotics & underwent general anesthesia without any difficulty.  Foley catheter was sterilely placed. SCDs were active during the entire case.  I examined the perineum.  The coloanal anastomosis was well-healed.  At least 2-1/2 cm in diameter.  Worsening meters male verge.  No evidence of abscess or fistula.  Sphincter tone intact.  Neorectum soft and normal.  I detected no need for delay of loop ileostomy takedown.  The abdomen was prepped and draped in a sterile fashion.  A surgical timeout confirmed our plan.  I made a circular curvilinear incision transversely around the loop ileostomy.  I got into the subcutaneous tissues.  I used careful focused cautery dissection and sharp dissection.  Some focused cautery dissection as well.  That helped to free adhesions to the subcutaneous tisses & fascia.  I was able to enter into the peritoneum focally.  I did a gentle finger sweep.  Gradually came around circumferentially and freed the loop of ileum from remaining adhesions to the abdominal wall.  We were able eviscerate some bowel proximally and distally mostly.    There was a small enterotomy in the distal end.  I used that as the opening for the staple.  I did a side-to-side stapled anastomosis using a 75 GIA.  Staple line intact with because all around.  No bleeding.  No ischemia.   I used a TA 60 to staple off the common defect.  I resected the loop ileostomy and the intra-abdominal component of the loop that had moderate adhesions.  Took the mesentery with clamps and silk  ties.  I closed the mesenteric defect using interrupted silk stitches.  The anastomosis looked healthy and viable.  Silk stitch placed at the crotch of the anastomosis.  We returned the anastomosis into the abdominal cavity.  Finger sweep circumferentially noted no adhesions.  It rested well.  We changed gloves and instruments.  Irrigated copiously into the wound and fascia.  I closed the peritoneum and the rectus muscle vertically with 0 Vicryl suture running   I reapproximated the fascia transversely using #1 PDS stitches.  I irrigated into the subcutaneous tissues.  I reapproximated the skin using 4-0 Monocryl stitch interrupted centrally.  I trimmed the dogear corners to allow tooth flattened down.  I left the corners open.  I packed the corners with antibiotic soaked umbilical tape for wicks.  Sterile dressing was applied.  The patient is being extubated to go to recovery room.  I discussed postop care with the patient in the office.  I discussed it in the holding area.  Instructions are written.  I discussed the patient's status to the patient's wife.  Questions were answered.  They expressed understanding & appreciation.  [name], M.D., F.A.C.S. Gastrointestinal and Minimally Invasive Surgery [facility] [address] [place], [place] [id number] [phone] Main / Paging

## 2015-01-30 NOTE — Discharge Instructions (Signed)
ABDOMINAL SURGERY: POST OP INSTRUCTIONS  1. DIET: Follow a light bland diet the first 24 hours after arrival home, such as soup, liquids, crackers, etc.  Be sure to include lots of fluids daily.  Avoid fast food or heavy meals as your are more likely to get nauseated.  Eat a low fat the next few days after surgery.   2. Take your usually prescribed home medications unless otherwise directed. 3. PAIN CONTROL: a. Pain is best controlled by a usual combination of three different methods TOGETHER: i. Ice/Heat ii. Over the counter pain medication iii. Prescription pain medication b. Most patients will experience some swelling and bruising around the incisions.  Ice packs or heating pads (30-60 minutes up to 6 times a day) will help. Use ice for the first few days to help decrease swelling and bruising, then switch to heat to help relax tight/sore spots and speed recovery.  Some people prefer to use ice alone, heat alone, alternating between ice & heat.  Experiment to what works for you.  Swelling and bruising can take several weeks to resolve.   c. It is helpful to take an over-the-counter pain medication regularly for the first few weeks.  Choose one of the following that works best for you: i. Naproxen (Aleve, etc)  Two 228m tabs twice a day ii. Ibuprofen (Advil, etc) Three 2071mtabs four times a day (every meal & bedtime) iii. Acetaminophen (Tylenol, etc) 500-65061mour times a day (every meal & bedtime) d. A  prescription for pain medication (such as oxycodone, hydrocodone, etc) should be given to you upon discharge.  Take your pain medication as prescribed.  i. If you are having problems/concerns with the prescription medicine (does not control pain, nausea, vomiting, rash, itching, etc), please call us [phone] see if we need to switch you to a different pain medicine that will work better for you and/or control your side effect better. ii. If you need a refill on your pain medication,  please contact your pharmacy.  They will contact our office to request authorization. Prescriptions will not be filled after 5 pm or on week-ends. 4. Avoid getting constipated.  Between the surgery and the pain medications, it is common to experience some constipation.  Increasing fluid intake and taking a fiber supplement (such as Metamucil, Citrucel, FiberCon, MiraLax, etc) 1-2 times a day regularly will usually help prevent this problem from occurring.  A mild laxative (prune juice, Milk of Magnesia, MiraLax, etc) should be taken according to package directions if there are no bowel movements after 48 hours.   5. Watch out for diarrhea.  If you have many loose bowel movements, simplify your diet to bland foods & liquids for a few days.  Stop any stool softeners and decrease your fiber supplement.  Switching to mild anti-diarrheal medications (Kayopectate, Pepto Bismol) can help.  If this worsens or does not improve, please call us.[place]. Wash / shower every day.  You may shower over the incision / wound.  Avoid baths until the skin is fully healed.  Continue to shower over incision(s) after the dressing is off. 7. Remove your waterproof bandages 5 days after surgery.  You may leave the incision open to air.  Remove any wicks or ribbons in your wound.  If you have an open wound, please see wound care instructions. You may replace a dressing/Band-Aid to cover the incision for comfort if you wish. 8. ACTIVITIES as tolerated:   a. You may resume regular (light)  daily activities beginning the next day--such as daily self-care, walking, climbing stairs--gradually increasing activities as tolerated.  If you can walk 30 minutes without difficulty, it is safe to try more intense activity such as jogging, treadmill, bicycling, low-impact aerobics, swimming, etc. b. Save the most intensive and strenuous activity for last such as sit-ups, heavy lifting, contact sports, etc  Refrain from any heavy lifting or straining  until you are off narcotics for pain control.   c. DO NOT PUSH THROUGH PAIN.  Let pain be your guide: If it hurts to do something, don't do it.  Pain is your body warning you to avoid that activity for another week until the pain goes down. d. You may drive when you are no longer taking prescription pain medication, you can comfortably wear a seatbelt, and you can safely maneuver your car and apply brakes. e. Dennis Bast may have sexual intercourse when it is comfortable.  9. FOLLOW UP in our office a. Please call [facility] at [phone] to set up an appointment to see your surgeon in the office for a follow-up appointment approximately 1-2 weeks after your surgery. b. Make sure that you call for this appointment the day you arrive home to insure a convenient appointment time. 10. IF YOU HAVE DISABILITY OR FAMILY LEAVE FORMS, BRING THEM TO THE OFFICE FOR PROCESSING.  DO NOT GIVE THEM TO YOUR DOCTOR.   WHEN TO CALL us [phone]: 1. Poor pain control 2. Reactions / problems with new medications (rash/itching, nausea, etc)  3. Fever over 101.5 F (38.5 C) 4. Inability to urinate 5. Nausea and/or vomiting 6. Worsening swelling or bruising 7. Continued bleeding from incision. 8. Increased pain, redness, or drainage from the incision  The clinic staff is available to answer your questions during regular business hours (8:30am-5pm).  Please dont hesitate to call and ask to speak to one of our nurses for clinical concerns.   A surgeon from [facility] is always on call at the hospitals   If you have a medical emergency, go to the nearest emergency room or call 911.    [facility], [place], [place], [place], [place]  [address] ? MAIN: [phone] ? TOLL FREE: [phone] ? FAX [phone] [email or web address]  GETTING TO GOOD BOWEL HEALTH. Irregular bowel habits such as constipation and diarrhea can lead to many problems over time.   Having one soft bowel movement a day is the most important way to prevent further problems.  The anorectal canal is designed to handle stretching and feces to safely manage our ability to get rid of solid waste (feces, poop, stool) out of our body.  BUT, hard constipated stools can act like ripping concrete bricks and diarrhea can be a burning fire to this very sensitive area of our body, causing inflamed hemorrhoids, anal fissures, increasing risk is perirectal abscesses, abdominal pain/bloating, an making irritable bowel worse.     The goal: ONE SOFT BOWEL MOVEMENT A DAY!  To have soft, regular bowel movements:   Drink at least 8 tall glasses of water a day.    Take plenty of fiber.  Fiber is the undigested part of plant food that passes into the colon, acting s natures broom to encourage bowel motility and movement.  Fiber can absorb and hold large amounts of water. This results in a larger, bulkier stool, which is soft and easier to pass. Work gradually over several weeks up to 6 servings a day of fiber (  25g a day even more if needed) in the form of: o Vegetables -- Root (potatoes, carrots, turnips), leafy green (lettuce, salad greens, celery, spinach), or cooked high residue (cabbage, broccoli, etc) o Fruit -- Fresh (unpeeled skin & pulp), Dried (prunes, apricots, cherries, etc ),  or stewed ( applesauce)  o Whole grain breads, pasta, etc (whole wheat)  o Bran cereals   Bulking Agents -- This type of water-retaining fiber generally is easily obtained each day by one of the following:  o Psyllium bran -- The psyllium plant is remarkable because its ground seeds can retain so much water. This product is available as Metamucil, Konsyl, Effersyllium, Per Diem Fiber, or the less expensive generic preparation in drug and health food stores. Although labeled a laxative, it really is not a laxative.  o Methylcellulose -- This is another fiber derived from wood which also retains water. It is available as  Citrucel. o Polyethylene Glycol - and artificial fiber commonly called Miralax or Glycolax.  It is helpful for people with gassy or bloated feelings with regular fiber o Flax Seed - a less gassy fiber than psyllium  No reading or other relaxing activity while on the toilet. If bowel movements take longer than 5 minutes, you are too constipated  AVOID CONSTIPATION.  High fiber and water intake usually takes care of this.  Sometimes a laxative is needed to stimulate more frequent bowel movements, but   Laxatives are not a good long-term solution as it can wear the colon out. o Osmotics (Milk of Magnesia, Fleets phosphosoda, Magnesium citrate, MiraLax, GoLytely) are safer than  o Stimulants (Senokot, Castor Oil, Dulcolax, Ex Lax)    o Do not take laxatives for more than 7days in a row.   IF SEVERELY CONSTIPATED, try a Bowel Retraining Program: o Do not use laxatives.  o Eat a diet high in roughage, such as bran cereals and leafy vegetables.  o Drink six (6) ounces of prune or apricot juice each morning.  o Eat two (2) large servings of stewed fruit each day.  o Take one (1) heaping tablespoon of a psyllium-based bulking agent twice a day. Use sugar-free sweetener when possible to avoid excessive calories.  o Eat a normal breakfast.  o Set aside 15 minutes after breakfast to sit on the toilet, but do not strain to have a bowel movement.  o If you do not have a bowel movement by the third day, use an enema and repeat the above steps.   Controlling diarrhea o Switch to liquids and simpler foods for a few days to avoid stressing your intestines further. o Avoid dairy products (especially milk & ice cream) for a short time.  The intestines often can lose the ability to digest lactose when stressed. o Avoid foods that cause gassiness or bloating.  Typical foods include beans and other legumes, cabbage, broccoli, and dairy foods.  Every person has some sensitivity to other foods, so listen to our  body and avoid those foods that trigger problems for you. o Adding fiber (Citrucel, Metamucil, psyllium, Miralax) gradually can help thicken stools by absorbing excess fluid and retrain the intestines to act more normally.  Slowly increase the dose over a few weeks.  Too much fiber too soon can backfire and cause cramping & bloating. o Probiotics (such as active yogurt, Align, etc) may help repopulate the intestines and colon with normal bacteria and calm down a sensitive digestive tract.  Most studies show it to be of mild help,  though, and such products can be costly. o Medicines: - Bismuth subsalicylate (ex. Kayopectate, Pepto Bismol) every 30 minutes for up to 6 doses can help control diarrhea.  Avoid if pregnant. - Loperamide (Immodium) can slow down diarrhea.  Start with two tablets (4mg  total) first and then try one tablet every 6 hours.  Avoid if you are having fevers or severe pain.  If you are not better or start feeling worse, stop all medicines and call your doctor for advice o Call your doctor if you are getting worse or not better.  Sometimes further testing (cultures, endoscopy, X-ray studies, bloodwork, etc) may be needed to help diagnose and treat the cause of the diarrhea. o   Managing Pain  Pain after surgery or related to activity is often due to strain/injury to muscle, tendon, nerves and/or incisions.  This pain is usually short-term and will improve in a few months.   Many people find it helpful to do the following things TOGETHER to help speed the process of healing and to get back to regular activity more quickly:  1. Avoid heavy physical activity a.  no lifting greater than 20 pounds b. Do not push through the pain.  Listen to your body and avoid positions and maneuvers than reproduce the pain c. Walking is okay as tolerated, but go slowly and stop when getting sore.  d. Remember: If it hurts to do it, then dont do it! 2. Take Anti-inflammatory medication  a. Take  with food/snack around the clock for 1-2 weeks i. This helps the muscle and nerve tissues become less irritable and calm down faster b. Choose ONE of the following over-the-counter medications: i. Naproxen 220mg  tabs (ex. Aleve) 1-2 pills twice a day  ii. Ibuprofen 200mg  tabs (ex. Advil, Motrin) 3-4 pills with every meal and just before bedtime iii. Acetaminophen 500mg  tabs (Tylenol) 1-2 pills with every meal and just before bedtime 3. Use a Heating pad or Ice/Cold Pack a. 4-6 times a day b. May use warm bath/hottub  or showers 4. Try Gentle Massage and/or Stretching  a. at the area of pain many times a day b. stop if you feel pain - do not overdo it  Try these steps together to help you body heal faster and avoid making things get worse.  Doing just one of these things may not be enough.    If you are not getting better after two weeks or are noticing you are getting worse, contact our office for further advice; we may need to re-evaluate you & see what other things we can do to help.  Pelvic floor muscle training exercises ("Kegels") can help strengthen the muscles under the uterus, bladder, and bowel (large intestine). They can help both men and women who have problems with urine leakage or bowel control.  A pelvic floor muscle training exercise is like pretending that you have to urinate, and then holding it. You relax and tighten the muscles that control urine flow. It's important to find the right muscles to tighten.  The next time you have to urinate, start to go and then stop. Feel the muscles in your vagina, bladder, or anus get tight and move up. These are the pelvic floor muscles. If you feel them tighten, you've done the exercise right. If you are still not sure whether you are tightening the right muscles, keep in mind that all of the muscles of the pelvic floor relax and contract at the same time. Because these muscles control  the bladder, rectum, and vagina, the following tips  may help: Women: Insert a finger into your vagina. Tighten the muscles as if you are holding in your urine, then let go. You should feel the muscles tighten and move up and down.  Men: Insert a finger into your rectum. Tighten the muscles as if you are holding in your urine, then let go. You should feel the muscles tighten and move up and down. These are the same muscles you would tighten if you were trying to prevent yourself from passing gas.  It is very important that you keep the following muscles relaxed while doing pelvic floor muscle training exercises: Abdominal  Buttocks (the deeper, anal sphincter muscle should contract)  Thigh   A woman can also strengthen these muscles by using a vaginal cone, which is a weighted device that is inserted into the vagina. Then you try to tighten the pelvic floor muscles to hold the device in place. If you are unsure whether you are doing the pelvic floor muscle training correctly, you can use biofeedback and electrical stimulation to help find the correct muscle group to work. Biofeedback is a method of positive reinforcement. Electrodes are placed on the abdomen and along the anal area. Some therapists place a sensor in the vagina in women or anus in men to monitor the contraction of pelvic floor muscles.  A monitor will display a graph showing which muscles are contracting and which are at rest. The therapist can help find the right muscles for performing pelvic floor muscle training exercises.   PERFORMING PELVIC FLOOR EXERCISES: 1. Begin by emptying your bladder. 2. Tighten the pelvic floor muscles and hold for a count of 10. 3. Relax the muscles completely for a count of 10. 4. Do 10 repititions, 3 to 5 times a day (morning, afternoon, and night). You can do these exercises at any time and any place. Most people prefer to do the exercises while lying down or sitting in a chair. After 4 - 6 weeks, most people notice some improvement. It may take as  long as 3 months to see a major change. After a couple of weeks, you can also try doing a single pelvic floor contraction at times when you are likely to leak (for example, while getting out of a chair). A word of caution: Some people feel that they can speed up the progress by increasing the number of repetitions and the frequency of exercises. However, over-exercising can instead cause muscle fatigue and increase urine leakage. If you feel any discomfort in your abdomen or back while doing these exercises, you are probably doing them wrong. Breathe deeply and relax your body when you are doing these exercises. Make sure you are not tightening your stomach, thigh, buttock, or chest muscles. When done the right way, pelvic floor muscle exercises have been shown to be very effective at improving urinary continence.  Pelvic Floor Pain / Incontinence  Do you suffer from pelvic pain or incontinence? Do you have pain in the pelvis, low back or hips that is associated with sitting, walking, urination or intercourse? Have you experienced leaking of urine or feces when coughing, sneezing or laughing? Do you have pain in the pelvic area associated with cancer?  These are conditions that are common with pelvic floor muscle dysfunction. Over time, due to stress, scar tissue, surgeries and the natural course of aging, our muscles may become weak or overstressed and can spasm. This can lead to pain, weakness, incontinence  or decreased quality of life.  Men and women with pelvic floor dysfunction frequently describe:  A falling out feeling. Pain or burning in the abdomen, tailbone or perineal area. Constipation or bowel elimination problems or difficulty initiating urination. Unresolved low back or hip pain. Frequency and urgency when going to the bathroom. Leaking of urine or feces. Pain with  intercourse.  [email or web address]  To make a referral or for more information about [facility] Pelvic Floor Therapy Program, call  [facility]) - [place] [phone] [place]) - [facility]) - [phone]

## 2015-01-30 NOTE — Anesthesia Postprocedure Evaluation (Signed)
  Anesthesia Post-op Note  Patient: Darren Hill  Procedure(s) Performed: Procedure(s) (LRB): TAKEDOWN OF LOOP ILEOSTOMY, EXAM UNDER ANESTHESIA (N/A)  Patient Location: PACU  Anesthesia Type: General  Level of Consciousness: awake and alert   Airway and Oxygen Therapy: Patient Spontanous Breathing  Post-op Pain: mild  Post-op Assessment: Post-op Vital signs reviewed, Patient's Cardiovascular Status Stable, Respiratory Function Stable, Patent Airway and No signs of Nausea or vomiting  Last Vitals:  Filed Vitals:   01/30/15 1515  BP: 143/89  Pulse: 84  Temp:   Resp: 11    Post-op Vital Signs: stable   Complications: No apparent anesthesia complications

## 2015-01-30 NOTE — Progress Notes (Signed)
RLQ dressing saturated with red drainage and clotted material. Dr. Johney Maine at bedside. ABD pad and large ice pack applied with manuel pressure applied.

## 2015-01-30 NOTE — H&P (Signed)
Darren Hill 01/12/2015 10:16 AM Location: [facility] Patient #: [id number] DOB: 1966-09-13 Married / Language: English / Race: Black or African American Male  History of Present Illness Darren Hill; 01/12/2015 10:39 AM) Patient words: rectal recheck.  The patient is a 49 year old male presenting for a post-operative visit. The patient returns status post robotically assisted low anterior section with diverting loop ileostomy 08/29/2014. He feels that he is recovering well so far. He completed his 5 cycles of post-adjuvant oral Xeloda. Last course started 26 January. He is emptying the bag 6-8 times a day. Not needing anti-diarrheal meds. Not dizzy. Urinating well. No fevers or chills. Minimal discomfort. Mild poling around the ileostomy. No longer needing oxycodone. Appetite good. Energy level better. 08/29/2014 1:51 PM PATIENT: Darren Hill 49 y.o. male Patient Care Team: Darren Pier, Hill as PCP - General (Oncology) Darren Pier, Hill as Consulting Physician (Oncology) Darren Round, Hill as Consulting Physician (Radiation Oncology) Darren Sabins, Hill as Consulting Physician (Gastroenterology) Darren Hill as Consulting Physician (General Surgery) Darren Hill as Consulting Physician (Cardiology) PRE-OPERATIVE DIAGNOSIS: MID RECTAL CANCER POST-OPERATIVE DIAGNOSIS: MID RECTAL CANCER PROCEDURE: Procedure(s): MINIMALLY INVASIVE ROBOTIC LOW ANTERIOR RESECTION DIVERTING LOOP ILEOSTOMY SPLENIC FLEXURE MOBILIZATION RIGID PROCTOSCOPY SURGEON: Surgeon(s): Darren Hill Darren Ruff, Hill - Assist OR FINDINGS: Patient had Bulky tumor starting at the distal peritoneal reflection the mid rectum. No obvious metastatic disease on visceral parietal peritoneum or liver. The anastomosis rests 4-5 cm from the anal verge by rigid proctoscopy. 1. Clinical stage II Rectal cancer, posterior rectal mass identified at 10-15 cm from the anal verge, a biopsy 03/31/2014  confirmed adenocarcinoma  Endoscopic ultrasound 04/03/2014 revealed a (uT3,uN0) lesion   Initiation of radiation/Xeloda 05/05/2014, completed 06/16/2014.  Low anterior resection with diverting loop ileostomy 08/29/2014. Pathology showed invasive adenocarcinoma arising in a background of tubular adenoma invading into but not through the muscularis propria. There was no evidence of angiolymphatic invasion or perineural invasion. There was one hyperplastic polyp with no adenomatous change or malignancy. One of 12 lymph nodes was positive for metastatic carcinoma. Resection margins were negative for atypia or malignancy. Pathologic staging pT2, pN1a.   Microsatellite stable; preserved expression of the major and minor MMR proteins.  Cycle 1 adjuvant Xeloda 09/25/2014.  Cycle 2 adjuvant Xeloda 10/16/2014  Cycle 3 adjuvant Xeloda 11/06/2014  Cycle 4 adjuvant Xeloda 11/27/2014 Cycle 5 on 12/18/2014 CLINICAL DATA: Rectal cancer. Preoperative evaluation for ileostomy takedown. EXAM: WATER SOLUBLE CONTRAST ENEMA TECHNIQUE: The entire colon was filled in a retrograde manner with water-soluble contrast. FLUOROSCOPY TIME: Fluoroscopy Time: 1 min 48 seconds Number of Acquired Images: 16 COMPARISON: CT scan dated 04/04/2014 FINDINGS: The rectal anastomosis is widely patent with no evidence of leakage or other abnormality. No stricture. The remaining portion of the colon was filled without reflux into the terminal ileum. Stricture. There are retained secretions in the colon. IMPRESSION: Widely patent rectal anastomosis with no evidence of extravasation or other significant abnormality. Electronically Signed By: Darren Hill M.D. On: 01/06/2015 10:53   Other Problems Darren Buffy, RN; 01/12/2015 10:17 AM) Rectal Cancer High blood pressure  Past Surgical History Darren Buffy, RN; 01/12/2015 10:17 AM) RESECTION, RECTUM, LOW ANTERIOR, ROBOT-ASSISTED (S2900)08/29/2014  Diagnostic Studies  History Darren Buffy, RN; 01/12/2015 10:17 AM) Colonoscopy within last year  Allergies Darren Buffy, RN; 01/12/2015 10:17 AM) No Known Drug Allergies10/21/2015  Medication History Darren Buffy, RN; 01/12/2015 10:17 AM) Lisinopril (10MG Tablet, Oral) Active. Medications Reconciled  Social History Darren Buffy, RN; 01/12/2015 10:17 AM) Caffeine use Carbonated beverages. Alcohol use Remotely quit alcohol use. No drug use Tobacco use Former smoker.  Family History Darren Buffy, RN; 01/12/2015 10:17 AM) Diabetes Mellitus Mother.  Review of Systems Darren Hector, Hill; 01/12/2015 10:23 00) General Present- Weight Loss. Not Present- Appetite Loss, Chills, Fatigue, Fever, Night Sweats and Weight Gain. Skin Not Present- Change in Wart/Mole, Dryness, Hives, Jaundice, New Lesions, Non-Healing Wounds, Rash and Ulcer. HEENT Not Present- Earache, Hearing Loss, Hoarseness, Nose Bleed, Oral Ulcers, Ringing in the Ears, Seasonal Allergies, Sinus Pain, Sore Throat, Visual Disturbances, Wears glasses/contact lenses and Yellow Eyes. Respiratory Not Present- Bloody sputum, Chronic Cough, Difficulty Breathing, Snoring and Wheezing. Breast Not Present- Breast Mass, Breast Pain, Nipple Discharge and Skin Changes. Cardiovascular Not Present- Chest Pain, Difficulty Breathing Lying Down, Leg Cramps, Palpitations, Rapid Heart Rate, Shortness of Breath and Swelling of Extremities. Gastrointestinal Not Present- Abdominal Pain, Bloating, Bloody Stool, Change in Bowel Habits, Chronic diarrhea, Constipation, Difficulty Swallowing, Excessive gas, Gets full quickly at meals, Hemorrhoids, Indigestion, Nausea, Rectal Pain and Vomiting. Male Genitourinary Not Present- Blood in Urine, Change in Urinary Stream, Frequency, Impotence, Nocturia, Painful Urination, Urgency and Urine Leakage.   Vitals Darren Shan Moffitt RN; 01/12/2015 10:17 AM) 01/12/2015 10:16 AM Weight: 176.2 lb Height: 69in Body  Surface Area: 1.97 m Body Mass Index: 26.02 kg/m Temp.: 97.72F(Oral)  Pulse: 80 (Regular)  Resp.: 16 (Unlabored)  BP: 128/78 (Sitting, Left Arm, Standard)    Assessment & Plan Darren Hill; 01/12/2015 10:40 AM) Rectal Cancer Impression: Recovered remarkably well so far.  Continue ostomy care.  Plan ileostomy takedown one month after completion of chemotherapy = later this month vs early March. Most likely next year. Discussed with that one in detail. He is motivated to get it down when safe. Current Plans  The anatomy & physiology of the digestive tract was discussed. The pathophysiology was discussed. Possibility of remaining with an ostomy permanently was discussed. I offered ostomy takedown. Laparoscopic & open techniques were discussed.  Risks such as bleeding, infection, abscess, leak, reoperation, possible re-ostomy, injury to other organs, hernia, heart attack, death, and other risks were discussed. I noted a good likelihood this will help address the problem. Goals of post-operative recovery were discussed as well. We will work to minimize complications. Questions were answered. The patient expresses understanding & wishes to proceed with surgery. Pt Education - [facility]dominal [facility] (Antario Yasuda) Pt Education - [facility] Good Bowel Health (Anthoney Sheppard) Pt Education - [facility] Pain Control (Storey Stangeland) Schedule for Surgery   Signed by Darren Hector, Hill (01/12/2015 10:42 AM)

## 2015-01-30 NOTE — Anesthesia Procedure Notes (Signed)
Procedure Name: Intubation Date/Time: 01/30/2015 11:51 AM Performed by: Glory Buff Pre-anesthesia Checklist: Patient identified, Emergency Drugs available, Suction available, Patient being monitored and Timeout performed Patient Re-evaluated:Patient Re-evaluated prior to inductionOxygen Delivery Method: Circle system utilized Preoxygenation: Pre-oxygenation with 100% oxygen Intubation Type: IV induction Ventilation: Mask ventilation without difficulty Laryngoscope Size: Miller and 3 Grade View: Grade I Tube type: Oral Tube size: 7.5 mm Number of attempts: 1 Airway Equipment and Method: Stylet Placement Confirmation: ETT inserted through vocal cords under direct vision,  positive ETCO2 and breath sounds checked- equal and bilateral Secured at: 21 cm Tube secured with: Tape Dental Injury: Teeth and Oropharynx as per pre-operative assessment

## 2015-01-31 LAB — BASIC METABOLIC PANEL
Anion gap: 5 (ref 5–15)
BUN: 13 mg/dL (ref 6–23)
CO2: 28 mmol/L (ref 19–32)
Calcium: 8.5 mg/dL (ref 8.4–10.5)
Chloride: 102 mmol/L (ref 96–112)
Creatinine, Ser: 1.22 mg/dL (ref 0.50–1.35)
GFR calc Af Amer: 80 mL/min — ABNORMAL LOW (ref >90)
GFR, EST NON AFRICAN AMERICAN: 69 mL/min — AB (ref >90)
GLUCOSE: 116 mg/dL — AB (ref 70–99)
Potassium: 4.7 mmol/L (ref 3.5–5.1)
Sodium: 135 mmol/L (ref 135–145)

## 2015-01-31 LAB — CBC
HEMATOCRIT: 31.7 % — AB (ref 39.0–52.0)
Hemoglobin: 10.9 g/dL — ABNORMAL LOW (ref 13.0–17.0)
MCH: 31.8 pg (ref 26.0–34.0)
MCHC: 34.4 g/dL (ref 30.0–36.0)
MCV: 92.4 fL (ref 78.0–100.0)
Platelets: 264 10*3/uL (ref 150–400)
RBC: 3.43 MIL/uL — ABNORMAL LOW (ref 4.22–5.81)
RDW: 15.1 % (ref 11.5–15.5)
WBC: 12.7 10*3/uL — AB (ref 4.0–10.5)

## 2015-01-31 LAB — MAGNESIUM: MAGNESIUM: 2.1 mg/dL (ref 1.5–2.5)

## 2015-01-31 NOTE — Progress Notes (Signed)
1 Day Post-Op  Subjective: Had a good night.  Tried a little soft food this AM.  No nausea.  No flatus or BM.  Has walked.  Had a little bleeding from incision initially.  Objective: Vital signs in last 24 hours: Temp:  [97.4 F (36.3 C)-98.2 F (36.8 C)] 97.8 F (36.6 C) (03/05 0600) Pulse Rate:  [60-111] 60 (03/05 0600) Resp:  [11-20] 16 (03/05 0600) BP: (119-164)/(80-97) 127/82 mmHg (03/05 0600) SpO2:  [97 %-100 %] 100 % (03/05 0600) Weight:  [171 lb 4.8 oz (77.7 kg)-174 lb (78.926 kg)] 171 lb 4.8 oz (77.7 kg) (03/05 0500) Last BM Date: 01/30/15 (preop)  Intake/Output from previous day: 03/04 0701 - 03/05 0700 In: 5117 [P.O.:1737; I.V.:3280; IV Piggyback:100] Out: 2925 [Urine:2925] Intake/Output this shift:    PE: General- In NAD Abdomen-soft, quiet, dressing dry  Lab Results:   Recent Labs  01/31/15 0505  WBC 12.7*  HGB 10.9*  HCT 31.7*  PLT 264   BMET  Recent Labs  01/31/15 0505  NA 135  K 4.7  CL 102  CO2 28  GLUCOSE 116*  BUN 13  CREATININE 1.22  CALCIUM 8.5   PT/INR No results for input(s): LABPROT, INR in the last 72 hours. Comprehensive Metabolic Panel:    Component Value Date/Time   NA 135 01/31/2015 0505   NA 136 01/20/2015 0821   NA 139 12/16/2014 0938   NA 137 08/30/2014 0530   K 4.7 01/31/2015 0505   K 4.0 01/20/2015 0821   K 4.4 12/16/2014 0938   K 4.1 08/30/2014 0530   CL 102 01/31/2015 0505   CL 101 08/30/2014 0530   CO2 28 01/31/2015 0505   CO2 27 01/20/2015 0821   CO2 29 12/16/2014 0938   CO2 28 08/30/2014 0530   BUN 13 01/31/2015 0505   BUN 10.0 01/20/2015 0821   BUN 11.1 12/16/2014 0938   BUN 13 08/30/2014 0530   CREATININE 1.22 01/31/2015 0505   CREATININE 1.3 01/20/2015 0821   CREATININE 1.4* 12/16/2014 0938   CREATININE 1.40* 08/30/2014 0530   GLUCOSE 116* 01/31/2015 0505   GLUCOSE 97 01/20/2015 0821   GLUCOSE 101 12/16/2014 0938   GLUCOSE 112* 08/30/2014 0530   CALCIUM 8.5 01/31/2015 0505   CALCIUM 8.9  01/20/2015 0821   CALCIUM 9.0 12/16/2014 0938   CALCIUM 8.6 08/30/2014 0530   AST 35* 01/20/2015 0821   AST 28 12/16/2014 0938   ALT 21 01/20/2015 0821   ALT 18 12/16/2014 0938   ALKPHOS 76 01/20/2015 0821   ALKPHOS 56 12/16/2014 0938   BILITOT 0.58 01/20/2015 0821   BILITOT 0.70 12/16/2014 0938   PROT 7.1 01/20/2015 0821   PROT 7.1 12/16/2014 0938   ALBUMIN 4.0 01/20/2015 0821   ALBUMIN 4.1 12/16/2014 0938     Studies/Results: No results found.  Anti-infectives: Anti-infectives    Start     Dose/Rate Route Frequency Ordered Stop   01/30/15 2200  cefoTEtan (CEFOTAN) 2 g in dextrose 5 % 50 mL IVPB     2 g 100 mL/hr over 30 Minutes Intravenous Every 12 hours 01/30/15 1612 01/30/15 2205   01/30/15 0856  cefoTEtan (CEFOTAN) 2 g in dextrose 5 % 50 mL IVPB     2 g 100 mL/hr over 30 Minutes Intravenous On call to O.R. 01/30/15 0856 01/30/15 1153      Assessment Principal Problem:   s/p ileostomy takedown 01/30/2015-stable overnight; mild ABL anemia Active Problems:   Rectal cancer [id number] - s/p LAR resection 08/29/2014  Hypertension   GERD (gastroesophageal reflux disease)    LOS: 1 day   Plan: Ambulate today.  Wait for return of bowel function.   Nero Sawatzky J 01/31/2015

## 2015-02-01 NOTE — Plan of Care (Signed)
Problem: Phase II Progression Outcomes Goal: Return of bowel function (flatus, BM) IF ABDOMINAL SURGERY:  Outcome: Progressing + flatus; pt reporting 2 episodes of small amount of liquid stool ("enough that you could see it")

## 2015-02-01 NOTE — Progress Notes (Signed)
2 Days Post-Op  Subjective: Passed a little gas.  Eating some solid food.  Walking. Objective: Vital signs in last 24 hours: Temp:  [97.5 F (36.4 C)-98.4 F (36.9 C)] 98.4 F (36.9 C) (03/06 0603) Pulse Rate:  [59-80] 78 (03/06 0603) Resp:  [16-20] 20 (03/06 0603) BP: (130-143)/(80-92) 136/90 mmHg (03/06 0603) SpO2:  [95 %-100 %] 95 % (03/06 0603) Weight:  [173 lb 1 oz (78.5 kg)] 173 lb 1 oz (78.5 kg) (03/06 0603) Last BM Date: 01/30/15 (pre-op)  Intake/Output from previous day: 03/05 0701 - 03/06 0700 In: 2525 [P.O.:720; I.V.:1805] Out: 1903 [Urine:1903] Intake/Output this shift:    PE: General- In NAD Abdomen-soft, incisions clean, wicks in larger incision  Lab Results:   Recent Labs  01/31/15 0505  WBC 12.7*  HGB 10.9*  HCT 31.7*  PLT 264   BMET  Recent Labs  01/31/15 0505  NA 135  K 4.7  CL 102  CO2 28  GLUCOSE 116*  BUN 13  CREATININE 1.22  CALCIUM 8.5   PT/INR No results for input(s): LABPROT, INR in the last 72 hours. Comprehensive Metabolic Panel:    Component Value Date/Time   NA 135 01/31/2015 0505   NA 136 01/20/2015 0821   NA 139 12/16/2014 0938   NA 137 08/30/2014 0530   K 4.7 01/31/2015 0505   K 4.0 01/20/2015 0821   K 4.4 12/16/2014 0938   K 4.1 08/30/2014 0530   CL 102 01/31/2015 0505   CL 101 08/30/2014 0530   CO2 28 01/31/2015 0505   CO2 27 01/20/2015 0821   CO2 29 12/16/2014 0938   CO2 28 08/30/2014 0530   BUN 13 01/31/2015 0505   BUN 10.0 01/20/2015 0821   BUN 11.1 12/16/2014 0938   BUN 13 08/30/2014 0530   CREATININE 1.22 01/31/2015 0505   CREATININE 1.3 01/20/2015 0821   CREATININE 1.4* 12/16/2014 0938   CREATININE 1.40* 08/30/2014 0530   GLUCOSE 116* 01/31/2015 0505   GLUCOSE 97 01/20/2015 0821   GLUCOSE 101 12/16/2014 0938   GLUCOSE 112* 08/30/2014 0530   CALCIUM 8.5 01/31/2015 0505   CALCIUM 8.9 01/20/2015 0821   CALCIUM 9.0 12/16/2014 0938   CALCIUM 8.6 08/30/2014 0530   AST 35* 01/20/2015 0821   AST 28  12/16/2014 0938   ALT 21 01/20/2015 0821   ALT 18 12/16/2014 0938   ALKPHOS 76 01/20/2015 0821   ALKPHOS 56 12/16/2014 0938   BILITOT 0.58 01/20/2015 0821   BILITOT 0.70 12/16/2014 0938   PROT 7.1 01/20/2015 0821   PROT 7.1 12/16/2014 0938   ALBUMIN 4.0 01/20/2015 0821   ALBUMIN 4.1 12/16/2014 0938     Studies/Results: No results found.  Anti-infectives: Anti-infectives    Start     Dose/Rate Route Frequency Ordered Stop   01/30/15 2200  cefoTEtan (CEFOTAN) 2 g in dextrose 5 % 50 mL IVPB     2 g 100 mL/hr over 30 Minutes Intravenous Every 12 hours 01/30/15 1612 01/30/15 2205   01/30/15 0856  cefoTEtan (CEFOTAN) 2 g in dextrose 5 % 50 mL IVPB     2 g 100 mL/hr over 30 Minutes Intravenous On call to O.R. 01/30/15 0856 01/30/15 1153      Assessment Principal Problem:   s/p ileostomy takedown 01/30/2015-bowel function starting to return Active Problems:   Rectal cancer [id number] - s/p LAR resection 08/29/2014   Hypertension   GERD (gastroesophageal reflux disease)    LOS: 2 days   Plan: Decrease IVF.  Home when  bowel function is consistently back.   Darren Hill J 02/01/2015

## 2015-02-02 ENCOUNTER — Encounter (HOSPITAL_COMMUNITY): Payer: Self-pay | Admitting: Surgery

## 2015-02-02 NOTE — Discharge Summary (Signed)
Physician Discharge Summary  Patient ID: Darren Hill MRN: [id number] DOB/AGE: [birth year] 49 y.o.  Admit date: 01/30/2015 Discharge date: 02/02/2015  Patient Care Team: Ladell Pier, MD as PCP - General (Oncology) Ladell Pier, MD as Consulting Physician (Oncology) Jodelle Braniyah Besse, MD as Consulting Physician (Radiation Oncology) Missy Sabins, MD as Consulting Physician (Gastroenterology) Michael Boston, MD as Consulting Physician (General Surgery) Josue Hector, MD as Consulting Physician (Cardiology)  Admission Diagnoses: Principal Problem:   Fecal diversion s/p ileostomy takedown 01/30/2015 Active Problems:   Rectal cancer ypT2ypN1M0 - s/p LAR resection 08/29/2014   Hypertension   GERD (gastroesophageal reflux disease)   Discharge Diagnoses:  Principal Problem:   Fecal diversion s/p ileostomy takedown 01/30/2015 Active Problems:   Rectal cancer ypT2ypN1M0 - s/p LAR resection 08/29/2014   Hypertension   GERD (gastroesophageal reflux disease)   POST-OPERATIVE DIAGNOSIS:  Loop Ileostomy in place for fecal diversion  SURGERY:  Procedure(s): TAKEDOWN OF LOOP ILEOSTOMY, EXAM UNDER ANESTHESIA  SURGEON:  Surgeon(s): Michael Boston, MD Leighton Ruff, MD  Consults: None  Hospital Course:   The patient underwent the surgery above.  Postoperatively, the patient gradually mobilized and advanced to a solid diet.  Pain and other symptoms were treated aggressively.  Bleeding at ostomy incision in PACU controlled with direct pressure - no more bleeding in hospital  By the time of discharge, the patient was walking well the hallways, eating food, having flatus.  Pain was well-controlled on an oral medications.  BMs slightly loose but controlled.  Based on meeting discharge criteria and continuing to recover, I felt it was safe for the patient to be discharged from the hospital to further recover with close followup. Postoperative recommendations were discussed in detail.  They are written as  well.   Significant Diagnostic Studies:  Results for orders placed or performed during the hospital encounter of 01/30/15 (from the past 72 hour(s))  Type and screen     Status: None   Collection Time: 01/30/15  9:25 AM  Result Value Ref Range   ABO/RH(D) O POS    Antibody Screen NEG    Sample Expiration 01/20/3611   Basic metabolic panel     Status: Abnormal   Collection Time: 01/31/15  5:05 AM  Result Value Ref Range   Sodium 135 135 - 145 mmol/L   Potassium 4.7 3.5 - 5.1 mmol/L   Chloride 102 96 - 112 mmol/L   CO2 28 19 - 32 mmol/L   Glucose, Bld 116 (H) 70 - 99 mg/dL   BUN 13 6 - 23 mg/dL   Creatinine, Ser 1.22 0.50 - 1.35 mg/dL   Calcium 8.5 8.4 - 10.5 mg/dL   GFR calc non Af Amer 69 (L) >90 mL/min   GFR calc Af Amer 80 (L) >90 mL/min    Comment: (NOTE) The eGFR has been calculated using the CKD EPI equation. This calculation has not been validated in all clinical situations. eGFR's persistently <90 mL/min signify possible Chronic Kidney Disease.    Anion gap 5 5 - 15  CBC     Status: Abnormal   Collection Time: 01/31/15  5:05 AM  Result Value Ref Range   WBC 12.7 (H) 4.0 - 10.5 K/uL   RBC 3.43 (L) 4.22 - 5.81 MIL/uL   Hemoglobin 10.9 (L) 13.0 - 17.0 g/dL   HCT 31.7 (L) 39.0 - 52.0 %   MCV 92.4 78.0 - 100.0 fL   MCH 31.8 26.0 - 34.0 pg   MCHC 34.4 30.0 -  36.0 g/dL   RDW 15.1 11.5 - 15.5 %   Platelets 264 150 - 400 K/uL  Magnesium     Status: None   Collection Time: 01/31/15  5:05 AM  Result Value Ref Range   Magnesium 2.1 1.5 - 2.5 mg/dL    No results found.  Discharge Exam: Blood pressure 137/85, pulse 93, temperature 97.6 F (36.4 C), temperature source Oral, resp. rate 18, height 5' 9"  (1.753 m), weight 173 lb 1 oz (78.5 kg), SpO2 98 %.  General: Pt awake/alert/oriented x4 in no major acute distress Eyes: PERRL, normal EOM. Sclera nonicteric Neuro: CN II-XII intact w/o focal sensory/motor deficits. Lymph: No head/neck/groin lymphadenopathy Psych:   No delerium/psychosis/paranoia HENT: Normocephalic, Mucus membranes moist.  No thrush Neck: Supple, No tracheal deviation Chest: No pain.  Good respiratory excursion. CV:  Pulses intact.  Regular rhythm MS: Normal AROM mjr joints.  No obvious deformity Abdomen: Soft, Nondistended.  Min tender at old RLQ ostomy site with normal healing ridge.  Wicks removed from wound.  No incarcerated hernias. Ext:  SCDs BLE.  No significant edema.  No cyanosis Skin: No petechiae / purpura  Discharged Condition: good   Past Medical History  Diagnosis Date  . Hypertension   . Colon cancer 04/03/14    rectal mass, colon ca  . Hx of radiation therapy 05/06/14-06/16/14    rectum  . CHEST PAIN 06/28/2010    Qualifier: Diagnosis of  By: Johnsie Cancel, MD, Rona Ravens   . Palpitations 06/28/2010    Qualifier: Diagnosis of  By: Johnsie Cancel, MD, Rona Ravens   . GERD (gastroesophageal reflux disease)     Past Surgical History  Procedure Laterality Date  . Eus N/A 04/03/2014    Procedure: LOWER ENDOSCOPIC ULTRASOUND (EUS);  Surgeon: Arta Silence, MD;  [facility];  Service: Endoscopy;  Laterality: N/A;  H/P in file cabinet   . Colon biopsy N/A 04/03/14    rectal mass=adenocarcinoma  . Diverting ileostomy  08/29/14    History   Social History  . Marital Status: Married    Spouse Name: N/A  . Number of Children: 2  . Years of Education: N/A   Occupational History  . TRUCK DRIVER    Social History Main Topics  . Smoking status: Former Smoker -- 0.25 packs/day for 15 years    Types: Cigarettes    Quit date: 04/03/2006  . Smokeless tobacco: Never Used  . Alcohol Use: No  . Drug Use: No  . Sexual Activity: Yes   Other Topics Concern  . Not on file   Social History Narrative    Family History  Problem Relation Age of Onset  . Brain cancer Maternal Aunt     not sure if tumor did operation on brain    Current Facility-Administered Medications  Medication Dose Route Frequency  Provider Last Rate Last Dose  . 0.9 %  sodium chloride infusion   Intravenous Continuous Jackolyn Confer, MD 30 mL/hr at 02/01/15 1252 30 mL/hr at 02/01/15 1252  . acetaminophen (TYLENOL) tablet 1,000 mg  1,000 mg Oral TID Michael Boston, MD   1,000 mg at 02/02/15 0214  . alum & mag hydroxide-simeth (MAALOX/MYLANTA) 200-200-20 MG/5ML suspension 30 mL  30 mL Oral Q6H PRN Michael Boston, MD   30 mL at 01/31/15 2121  . alvimopan (ENTEREG) capsule 12 mg  12 mg Oral BID Michael Boston, MD   12 mg at 02/01/15 2152  . chlorhexidine (HIBICLENS) 4 % liquid 4 application  60 mL  Topical Once Michael Boston, MD      . diphenhydrAMINE (BENADRYL) 12.5 MG/5ML elixir 12.5 mg  12.5 mg Oral Q6H PRN Michael Boston, MD       Or  . diphenhydrAMINE (BENADRYL) injection 12.5 mg  12.5 mg Intravenous Q6H PRN Michael Boston, MD      . heparin injection 5,000 Units  5,000 Units Subcutaneous 3 times per day Michael Boston, MD   5,000 Units at 02/02/15 [phone]  . HYDROmorphone (DILAUDID) injection 0.5-2 mg  0.5-2 mg Intravenous Q1H PRN Michael Boston, MD   2 mg at 02/01/15 1935  . lip balm (CARMEX) ointment 1 application  1 application Topical BID Michael Boston, MD   1 application at 48/54/62 1000  . lisinopril (PRINIVIL,ZESTRIL) tablet 5 mg  5 mg Oral q morning - 10a Michael Boston, MD   5 mg at 02/01/15 1304  . magic mouthwash  15 mL Oral QID PRN Michael Boston, MD      . menthol-cetylpyridinium (CEPACOL) lozenge 3 mg  1 lozenge Oral PRN Michael Boston, MD      . metoprolol (LOPRESSOR) injection 5 mg  5 mg Intravenous 4 times per day Michael Boston, MD   5 mg at 02/02/15 7035  . ondansetron (ZOFRAN) tablet 4 mg  4 mg Oral Q6H PRN Michael Boston, MD       Or  . ondansetron [facility]) injection 4 mg  4 mg Intravenous Q6H PRN Michael Boston, MD      . phenol (CHLORASEPTIC) mouth spray 2 spray  2 spray Mouth/Throat PRN Michael Boston, MD      . promethazine (PHENERGAN) injection 6.25-12.5 mg  6.25-12.5 mg Intravenous Q4H PRN Michael Boston, MD      .  saccharomyces boulardii (FLORASTOR) capsule 250 mg  250 mg Oral BID Michael Boston, MD   250 mg at 02/01/15 2152     No Known Allergies  Disposition: 06-Home-Health Care Svc  Discharge Instructions    Call MD for:  extreme fatigue    Complete by:  As directed      Call MD for:  extreme fatigue    Complete by:  As directed      Call MD for:  hives    Complete by:  As directed      Call MD for:  hives    Complete by:  As directed      Call MD for:  persistant nausea and vomiting    Complete by:  As directed      Call MD for:  persistant nausea and vomiting    Complete by:  As directed      Call MD for:  redness, tenderness, or signs of infection (pain, swelling, redness, odor or green/yellow discharge around incision site)    Complete by:  As directed      Call MD for:  redness, tenderness, or signs of infection (pain, swelling, redness, odor or green/yellow discharge around incision site)    Complete by:  As directed      Call MD for:  severe uncontrolled pain    Complete by:  As directed      Call MD for:  severe uncontrolled pain    Complete by:  As directed      Call MD for:    Complete by:  As directed   Temperature > 101.4F     Call MD for:    Complete by:  As directed   Temperature > 101.4F     Diet - low  sodium heart healthy    Complete by:  As directed      Discharge instructions    Complete by:  As directed   Please see discharge instruction sheets.  Also refer to handout given an office.  Please call our office if you have any questions or concerns [phone]     Discharge instructions    Complete by:  As directed   Please see discharge instruction sheets.  Also refer to handout given an office.  Please call our office if you have any questions or concerns [phone]     Discharge wound care:    Complete by:  As directed   If you have closed incisions, shower and bathe over these incisions with soap and water every day.  Remove all surgical dressings on  postoperative day #3.  You do not need to replace dressings over the closed incisions unless you feel more comfortable with a Band-Aid covering it.   If you have an open wound that requires packing, please see wound care instructions.  In general, remove all dressings, wash wound with soap and water and then replace with saline moistened gauze.  Do the dressing change at least every day.  Please call our office [phone] if you have further questions.     Discharge wound care:    Complete by:  As directed   If you have closed incisions, shower and bathe over these incisions with soap and water every day.  Remove all surgical dressings on postoperative day #3.  You do not need to replace dressings over the closed incisions unless you feel more comfortable with a Band-Aid covering it.   If you have an open wound that requires packing, please see wound care instructions.  In general, remove all dressings, wash wound with soap and water and then replace with saline moistened gauze.  Do the dressing change at least every day.  Please call our office [phone] if you have further questions.     Driving Restrictions    Complete by:  As directed   No driving until off narcotics and can safely swerve away without pain during an emergency     Driving Restrictions    Complete by:  As directed   No driving until off narcotics and can safely swerve away without pain during an emergency     Increase activity slowly    Complete by:  As directed   Walk an hour a day.  Use 20-30 minute walks.  When you can walk 30 minutes without difficulty, increase to low impact/moderate activities such as biking, jogging, swimming, sexual activity..  Eventually can increase to unrestricted activity when not feeling pain.  If you feel pain: STOP![name]   Let pain protect you from overdoing it.  Use ice/heat/over-the-counter pain medications to help minimize his soreness.  Use pain prescriptions as needed to remain active.  It is  better to take extra pain medications and be more active than to stay bedridden to avoid all pain medications.     Increase activity slowly    Complete by:  As directed   Walk an hour a day.  Use 20-30 minute walks.  When you can walk 30 minutes without difficulty, increase to low impact/moderate activities such as biking, jogging, swimming, sexual activity..  Eventually can increase to unrestricted activity when not feeling pain.  If you feel pain: STOP![name]   Let pain protect you from overdoing it.  Use ice/heat/over-the-counter pain medications to help minimize his  soreness.  Use pain prescriptions as needed to remain active.  It is better to take extra pain medications and be more active than to stay bedridden to avoid all pain medications.     Lifting restrictions    Complete by:  As directed   Avoid heavy lifting initially.  Do not push through pain.  You have no specific weight limit.  Coughing and sneezing or four more stressful to your incision than any lifting you will do. Pain will protect you from injury.  Therefore, avoid intense activity until off all narcotic pain medications.  Coughing and sneezing or four more stressful to your incision than any lifting he will do.     Lifting restrictions    Complete by:  As directed   Avoid heavy lifting initially.  Do not push through pain.  You have no specific weight limit.  Coughing and sneezing or four more stressful to your incision than any lifting you will do. Pain will protect you from injury.  Therefore, avoid intense activity until off all narcotic pain medications.  Coughing and sneezing or four more stressful to your incision than any lifting he will do.     May shower / Bathe    Complete by:  As directed      May shower / Bathe    Complete by:  As directed      May walk up steps    Complete by:  As directed      May walk up steps    Complete by:  As directed      Sexual Activity Restrictions    Complete by:  As directed   Sexual  activity as tolerated.  Do not push through pain.  Pain will protect you from injury.     Sexual Activity Restrictions    Complete by:  As directed   Sexual activity as tolerated.  Do not push through pain.  Pain will protect you from injury.     Walk with assistance    Complete by:  As directed   Walk over an hour a day.  May use a walker/cane/companion to help with balance and stamina.     Walk with assistance    Complete by:  As directed   Walk over an hour a day.  May use a walker/cane/companion to help with balance and stamina.            Medication List    STOP taking these medications        capecitabine 500 MG tablet  Commonly known as:  XELODA      TAKE these medications        lisinopril 10 MG tablet  Commonly known as:  PRINIVIL,ZESTRIL  Take 10 mg by mouth every morning.     oxyCODONE 5 MG immediate release tablet  Commonly known as:  Oxy IR/ROXICODONE  Take 1-2 tablets (5-10 mg total) by mouth every 4 (four) hours as needed for moderate pain, severe pain or breakthrough pain.           Follow-up Information    Follow up with Dorna Mallet C., MD. Schedule an appointment as soon as possible for a visit in 3 weeks.   Specialty:  General Surgery   Why:  To follow up after your operation, To follow up after your hospital stay   Contact information:   [address] [place] [address] [phone]        Signed: Cristal Generous  C. Johney Maine, M.D., F.A.C.S. Gastrointestinal and Minimally Invasive Surgery [facility] [address] [place], [place] [address] [phone] Main / Paging   02/02/2015, 8:04 AM

## 2015-03-25 ENCOUNTER — Telehealth: Payer: Self-pay | Admitting: Oncology

## 2015-03-25 NOTE — Telephone Encounter (Signed)
S/w pt confirming labs r/s and will c/b if need to change another day.

## 2015-03-30 ENCOUNTER — Other Ambulatory Visit: Payer: Commercial Managed Care - PPO

## 2015-03-30 ENCOUNTER — Ambulatory Visit (HOSPITAL_COMMUNITY): Payer: Commercial Managed Care - PPO

## 2015-03-31 ENCOUNTER — Ambulatory Visit (HOSPITAL_COMMUNITY)
Admission: RE | Admit: 2015-03-31 | Discharge: 2015-03-31 | Disposition: A | Discharge status: Home / Self Care | Payer: Commercial Managed Care - PPO | Source: Ambulatory Visit | Attending: Nurse Practitioner | Admitting: Nurse Practitioner

## 2015-03-31 ENCOUNTER — Other Ambulatory Visit (HOSPITAL_BASED_OUTPATIENT_CLINIC_OR_DEPARTMENT_OTHER): Payer: Commercial Managed Care - PPO

## 2015-03-31 DIAGNOSIS — C20 Malignant neoplasm of rectum: Secondary | ICD-10-CM

## 2015-03-31 DIAGNOSIS — C772 Secondary and unspecified malignant neoplasm of intra-abdominal lymph nodes: Secondary | ICD-10-CM

## 2015-03-31 DIAGNOSIS — Z85048 Personal history of other malignant neoplasm of rectum, rectosigmoid junction, and anus: Secondary | ICD-10-CM | POA: Diagnosis present

## 2015-03-31 DIAGNOSIS — J479 Bronchiectasis, uncomplicated: Secondary | ICD-10-CM | POA: Diagnosis not present

## 2015-03-31 LAB — COMPREHENSIVE METABOLIC PANEL (CC13)
ALK PHOS: 66 U/L (ref 40–150)
ALT: 16 U/L (ref 0–55)
ANION GAP: 11 meq/L (ref 3–11)
AST: 24 U/L (ref 5–34)
Albumin: 4.1 g/dL (ref 3.5–5.0)
BILIRUBIN TOTAL: 0.44 mg/dL (ref 0.20–1.20)
BUN: 9.4 mg/dL (ref 7.0–26.0)
CO2: 27 meq/L (ref 22–29)
CREATININE: 1.3 mg/dL (ref 0.7–1.3)
Calcium: 9.3 mg/dL (ref 8.4–10.4)
Chloride: 100 mEq/L (ref 98–109)
EGFR: 74 mL/min/{1.73_m2} — AB (ref >90)
GLUCOSE: 107 mg/dL (ref 70–140)
Potassium: 3.9 mEq/L (ref 3.5–5.1)
Sodium: 138 mEq/L (ref 136–145)
TOTAL PROTEIN: 7.5 g/dL (ref 6.4–8.3)

## 2015-03-31 LAB — CBC WITH DIFFERENTIAL/PLATELET
BASO%: 0.9 % (ref 0.0–2.0)
Basophils Absolute: 0 10*3/uL (ref 0.0–0.1)
EOS%: 4.3 % (ref 0.0–7.0)
Eosinophils Absolute: 0.2 10*3/uL (ref 0.0–0.5)
HEMATOCRIT: 41.1 % (ref 38.4–49.9)
HGB: 13.4 g/dL (ref 13.0–17.1)
LYMPH%: 35.8 % (ref 14.0–49.0)
MCH: 29.7 pg (ref 27.2–33.4)
MCHC: 32.7 g/dL (ref 32.0–36.0)
MCV: 90.8 fL (ref 79.3–98.0)
MONO#: 0.5 10*3/uL (ref 0.1–0.9)
MONO%: 12.1 % (ref 0.0–14.0)
NEUT%: 46.9 % (ref 39.0–75.0)
NEUTROS ABS: 2 10*3/uL (ref 1.5–6.5)
Platelets: 275 10*3/uL (ref 140–400)
RBC: 4.53 10*6/uL (ref 4.20–5.82)
RDW: 12.9 % (ref 11.0–14.6)
WBC: 4.3 10*3/uL (ref 4.0–10.3)
lymph#: 1.5 10*3/uL (ref 0.9–3.3)

## 2015-03-31 MED ORDER — IOHEXOL 300 MG/ML  SOLN: 80.0000 mL | Freq: Once | INTRAMUSCULAR | Status: AC | PRN

## 2015-04-01 LAB — CEA: CEA: 0.5 ng/mL (ref 0.0–5.0)

## 2015-04-06 ENCOUNTER — Telehealth: Payer: Self-pay | Admitting: Oncology

## 2015-04-06 ENCOUNTER — Ambulatory Visit (HOSPITAL_BASED_OUTPATIENT_CLINIC_OR_DEPARTMENT_OTHER): Payer: Commercial Managed Care - PPO | Admitting: Oncology

## 2015-04-06 VITALS — BP 123/82 | HR 71 | Temp 97.7°F | Resp 20 | Ht 69.0 in | Wt 181.4 lb

## 2015-04-06 DIAGNOSIS — C20 Malignant neoplasm of rectum: Secondary | ICD-10-CM

## 2015-04-06 DIAGNOSIS — C772 Secondary and unspecified malignant neoplasm of intra-abdominal lymph nodes: Secondary | ICD-10-CM

## 2015-04-06 NOTE — Telephone Encounter (Signed)
per pof to sch to sch pt appt-gave tp copy of sch

## 2015-04-06 NOTE — Progress Notes (Signed)
  Darren Hill OFFICE PROGRESS NOTE   Diagnosis: Rectal cancer  INTERVAL HISTORY:   Darren Hill returns as scheduled. He feels well. He underwent the ileostomy takedown procedure on 01/30/2015. He is working. No complaint. He has a bowel movement each morning.  Objective:  Vital signs in last 24 hours:  Blood pressure 123/82, pulse 71, temperature 97.7 F (36.5 C), temperature source Oral, resp. rate 20, height $RemoveBe'5\' 9"'ZucTFddZh$  (1.753 m), weight 181 lb 6.4 oz (82.283 kg), SpO2 100 %.    HEENT: Neck without mass Lymphatics: No cervical, supraclavicular, or inguinal nodes. "Shotty "bilateral axillary nodes. Resp: Lungs clear bilaterally Cardio: Regular rate and rhythm GI: No hepatosplenomegaly, nontender, no mass Vascular: No leg edema   Lab Results:  Lab Results  Component Value Date   WBC 4.3 03/31/2015   HGB 13.4 03/31/2015   HCT 41.1 03/31/2015   MCV 90.8 03/31/2015   PLT 275 03/31/2015   NEUTROABS 2.0 03/31/2015    Lab Results  Component Value Date   CEA 0.5 03/31/2015    Medications: I have reviewed the patient's current medications.  Assessment/Plan: 1. Clinical stage II Rectal cancer, posterior rectal mass identified at 10-15 cm from the anal verge, a biopsy 03/31/2014 confirmed adenocarcinoma  Endoscopic ultrasound 04/03/2014 revealed a (uT3,uN0) lesion   Initiation of radiation/Xeloda 05/05/2014, completed 06/16/2014.  Low anterior resection with diverting loop ileostomy 08/29/2014. Pathology showed invasive adenocarcinoma arising in a background of tubular adenoma invading into but not through the muscularis propria. There was no evidence of angiolymphatic invasion or perineural invasion. There was one hyperplastic polyp with no adenomatous change or malignancy. One of 12 lymph nodes was positive for metastatic carcinoma. Resection margins were negative for atypia or malignancy. Pathologic staging pT2, pN1a.   Microsatellite stable; preserved  expression of the major and minor MMR proteins.  Cycle 1 adjuvant Xeloda 09/25/2014.  Cycle 2 adjuvant Xeloda 10/16/2014  Cycle 3 adjuvant Xeloda 11/06/2014  Cycle 4 adjuvant Xeloda 11/27/2014  Cycle 5 adjuvant Xeloda 12/17/2014  Ileostomy takedown procedure 01/30/2015  CTs 03/31/2015 with subtle nodularity along the left paracolic peritoneal reflection and omentum 2. Hypertension.    Disposition:  Darren Hill is in clinical remission from rectal cancer. He will return for an office visit and CEA in 6 months. I will present his case at the GI tumor conference on 04/08/2015 to review the peritoneal nodularity noted on the 03/31/2015 CT. We will arrange for a follow-up CT as indicated. He will be referred to Dr. Amedeo Plenty for a surveillance colonoscopy.  Betsy Coder, MD  04/06/2015  8:45 AM

## 2015-04-08 ENCOUNTER — Telehealth: Payer: Self-pay | Admitting: Oncology

## 2015-04-08 ENCOUNTER — Telehealth: Payer: Self-pay | Admitting: *Deleted

## 2015-04-08 DIAGNOSIS — C772 Secondary and unspecified malignant neoplasm of intra-abdominal lymph nodes: Principal | ICD-10-CM

## 2015-04-08 DIAGNOSIS — C20 Malignant neoplasm of rectum: Secondary | ICD-10-CM

## 2015-04-08 NOTE — Telephone Encounter (Signed)
Per Dr. Benay Spice; notified pt that CT was reviewed at GI conference and nodular changes likely scarring from surgery but will re-check CT in 6 months prior to office visit; approximately November.  Pt verbalized understanding of information.

## 2015-04-08 NOTE — Telephone Encounter (Signed)
Confirm November. Mailed calendar

## 2015-07-19 DIAGNOSIS — K635 Polyp of colon: Secondary | ICD-10-CM | POA: Insufficient documentation

## 2015-09-18 ENCOUNTER — Other Ambulatory Visit: Payer: Self-pay | Admitting: Oncology

## 2015-09-18 DIAGNOSIS — C20 Malignant neoplasm of rectum: Secondary | ICD-10-CM

## 2015-09-18 DIAGNOSIS — C772 Secondary and unspecified malignant neoplasm of intra-abdominal lymph nodes: Principal | ICD-10-CM

## 2015-10-05 ENCOUNTER — Ambulatory Visit (HOSPITAL_COMMUNITY)
Admission: RE | Admit: 2015-10-05 | Discharge: 2015-10-05 | Disposition: A | Discharge status: Home / Self Care | Payer: Commercial Managed Care - PPO | Source: Ambulatory Visit | Attending: Oncology | Admitting: Oncology

## 2015-10-05 ENCOUNTER — Encounter (HOSPITAL_COMMUNITY): Payer: Self-pay

## 2015-10-05 ENCOUNTER — Other Ambulatory Visit (HOSPITAL_BASED_OUTPATIENT_CLINIC_OR_DEPARTMENT_OTHER): Payer: Commercial Managed Care - PPO

## 2015-10-05 ENCOUNTER — Ambulatory Visit (HOSPITAL_COMMUNITY): Payer: Commercial Managed Care - PPO

## 2015-10-05 DIAGNOSIS — R590 Localized enlarged lymph nodes: Secondary | ICD-10-CM | POA: Diagnosis present

## 2015-10-05 DIAGNOSIS — C772 Secondary and unspecified malignant neoplasm of intra-abdominal lymph nodes: Secondary | ICD-10-CM

## 2015-10-05 DIAGNOSIS — I7090 Unspecified atherosclerosis: Secondary | ICD-10-CM | POA: Diagnosis not present

## 2015-10-05 DIAGNOSIS — C20 Malignant neoplasm of rectum: Secondary | ICD-10-CM | POA: Diagnosis not present

## 2015-10-05 LAB — BASIC METABOLIC PANEL (CC13)
Anion Gap: 8 mEq/L (ref 3–11)
BUN: 10.3 mg/dL (ref 7.0–26.0)
CO2: 28 meq/L (ref 22–29)
Calcium: 9.7 mg/dL (ref 8.4–10.4)
Chloride: 102 mEq/L (ref 98–109)
Creatinine: 1.4 mg/dL — ABNORMAL HIGH (ref 0.7–1.3)
EGFR: 69 mL/min/{1.73_m2} — ABNORMAL LOW (ref >90)
Glucose: 103 mg/dl (ref 70–140)
Potassium: 3.8 mEq/L (ref 3.5–5.1)
Sodium: 139 mEq/L (ref 136–145)

## 2015-10-05 LAB — CEA: CEA: 1.1 ng/mL (ref 0.0–5.0)

## 2015-10-05 MED ORDER — IOHEXOL 300 MG/ML  SOLN
100.0000 mL | Freq: Once | INTRAMUSCULAR | Status: AC | PRN
  Administered 2015-10-05: 100 mL via INTRAVENOUS

## 2015-10-08 ENCOUNTER — Ambulatory Visit: Payer: Commercial Managed Care - PPO | Admitting: Oncology

## 2015-10-08 ENCOUNTER — Other Ambulatory Visit: Payer: Commercial Managed Care - PPO

## 2015-10-08 ENCOUNTER — Telehealth: Payer: Self-pay | Admitting: Oncology

## 2015-10-08 NOTE — Telephone Encounter (Signed)
Returned patient call re r/s today's appointment. 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 Gave patient new appointment for 12/23 @ 8:30 am - time per patient due to schedule. Message to desk nurse.

## 2015-11-20 ENCOUNTER — Telehealth: Payer: Self-pay | Admitting: Oncology

## 2015-11-20 ENCOUNTER — Ambulatory Visit (HOSPITAL_BASED_OUTPATIENT_CLINIC_OR_DEPARTMENT_OTHER): Payer: Commercial Managed Care - PPO | Admitting: Oncology

## 2015-11-20 VITALS — BP 133/82 | HR 98 | Temp 97.7°F | Resp 18 | Ht 69.0 in | Wt 189.8 lb

## 2015-11-20 DIAGNOSIS — C772 Secondary and unspecified malignant neoplasm of intra-abdominal lymph nodes: Secondary | ICD-10-CM

## 2015-11-20 DIAGNOSIS — C20 Malignant neoplasm of rectum: Secondary | ICD-10-CM | POA: Diagnosis not present

## 2015-11-20 NOTE — Progress Notes (Signed)
  [place] OFFICE PROGRESS NOTE   Diagnosis: Rectal cancer  INTERVAL HISTORY:   Mr. Duque returns as scheduled. He feels well. Good appetite and energy level. No difficulty with bowel function. No complaint.  Objective:  Vital signs in last 24 hours:  Blood pressure 133/82, pulse 98, temperature 97.7 F (36.5 C), temperature source Oral, resp. rate 18, height '5\' 9"'$  (1.753 m), weight 189 lb 12.8 oz (86.093 kg), SpO2 100 %.    HEENT:  Neck without mass Lymphatics:  No cervical, supraclavicular, axillary, or inguinal nodes Resp:  Lungs clear bilaterally Cardio:  Regular rate and rhythm GI:  No hepatomegaly, no mass, nontender Vascular:  No leg edema   Lab Results:   Lab Results  Component Value Date   CEA 1.1 10/05/2015    Medications: I have reviewed the patient's current medications.  Assessment/Plan: 1. Clinical stage II Rectal cancer, posterior rectal mass identified at 10-15 cm from the anal verge, a biopsy 03/31/2014 confirmed adenocarcinoma  Endoscopic ultrasound 04/03/2014 revealed a (uT3,uN0) lesion   Initiation of radiation/Xeloda 05/05/2014, completed 06/16/2014.  Low anterior resection with diverting loop ileostomy 08/29/2014. Pathology showed invasive adenocarcinoma arising in a background of tubular adenoma invading into but not through the muscularis propria. There was no evidence of angiolymphatic invasion or perineural invasion. There was one hyperplastic polyp with no adenomatous change or malignancy. One of 12 lymph nodes was positive for metastatic carcinoma. Resection margins were negative for atypia or malignancy. Pathologic staging pT2, pN1a.   Microsatellite stable; preserved expression of the major and minor MMR proteins.  Cycle 1 adjuvant Xeloda 09/25/2014.  Cycle 2 adjuvant Xeloda 10/16/2014  Cycle 3 adjuvant Xeloda 11/06/2014  Cycle 4 adjuvant Xeloda 11/27/2014  Cycle 5 adjuvant Xeloda 12/17/2014  Ileostomy takedown  procedure 01/30/2015  CTs 03/31/2015 with subtle nodularity along the left paracolic peritoneal reflection and omentum  CT abdomen/pelvis 10/05/2015 with improved to resolved nodularity at the left abdomen and paracolic gutter, no evidence of recurrent disease 2. Hypertension.    Disposition:   Mr. Leabo remains in clinical remission from rectal cancer. He will return for an office visit and CEA in 6 months. We referred him to Dr. Amedeo Plenty for a surveillance colonoscopy.  Betsy Coder, MD  11/20/2015  10:48 AM

## 2015-11-20 NOTE — Telephone Encounter (Signed)
Gave patient avs report and appointments for June. F/u scheduled for 6 month in line with expected date for next loab 05/20/16 - no specification given for f/u on 12/23 pof - just f/u with mid-level. Patient to see Dr. Amedeo Plenty 12/16/2015 @ 9 am - spoke with Reuben Likes at [facility] - patient aware.

## 2015-11-20 NOTE — Telephone Encounter (Signed)
Sent copy of referral along with fax coversheet to HIM to send notes to Dr. Amedeo Plenty at [phone].

## 2015-11-20 NOTE — Telephone Encounter (Signed)
Received message from desk nurse confirming f/u should be 6 mos and appointment with Dr. Amedeo Plenty is for colonoscopy.

## 2015-11-20 NOTE — Telephone Encounter (Signed)
Medical records faxed to [facility] [phone]

## 2015-12-04 ENCOUNTER — Other Ambulatory Visit: Payer: Self-pay | Admitting: *Deleted

## 2015-12-21 ENCOUNTER — Encounter: Payer: Self-pay | Admitting: Oncology

## 2015-12-23 ENCOUNTER — Telehealth: Payer: Self-pay | Admitting: Oncology

## 2015-12-23 NOTE — Telephone Encounter (Signed)
Left message for patient about appointment changes due to the provider being out of the office 6/23. Schedule sent by mail.

## 2015-12-24 NOTE — Telephone Encounter (Signed)
Left message on voicemail requesting clarification of email. Instructed pt to contact Dr. Amedeo Plenty if this is regarding his colonoscopy.

## 2016-05-20 ENCOUNTER — Other Ambulatory Visit: Payer: Commercial Managed Care - PPO

## 2016-05-20 ENCOUNTER — Ambulatory Visit: Payer: Commercial Managed Care - PPO | Admitting: Nurse Practitioner

## 2016-05-23 ENCOUNTER — Telehealth: Payer: Self-pay | Admitting: Oncology

## 2016-05-23 ENCOUNTER — Other Ambulatory Visit (HOSPITAL_BASED_OUTPATIENT_CLINIC_OR_DEPARTMENT_OTHER): Payer: Commercial Managed Care - PPO

## 2016-05-23 ENCOUNTER — Ambulatory Visit (HOSPITAL_BASED_OUTPATIENT_CLINIC_OR_DEPARTMENT_OTHER): Payer: Commercial Managed Care - PPO | Admitting: Nurse Practitioner

## 2016-05-23 VITALS — BP 133/86 | HR 72 | Temp 97.9°F | Resp 18 | Ht 69.0 in | Wt 192.4 lb

## 2016-05-23 DIAGNOSIS — C20 Malignant neoplasm of rectum: Secondary | ICD-10-CM

## 2016-05-23 DIAGNOSIS — Z85048 Personal history of other malignant neoplasm of rectum, rectosigmoid junction, and anus: Secondary | ICD-10-CM

## 2016-05-23 DIAGNOSIS — C772 Secondary and unspecified malignant neoplasm of intra-abdominal lymph nodes: Principal | ICD-10-CM

## 2016-05-23 NOTE — Telephone Encounter (Signed)
Gave and printed appt sched and avs fo rpt for Dec...gv barium

## 2016-05-23 NOTE — Progress Notes (Signed)
  [place] OFFICE PROGRESS NOTE   Diagnosis:  Rectal cancer  INTERVAL HISTORY:   Mr. Mogg returns as scheduled. He feels well. No change in bowel habits. No bloody or black stools. No abdominal pain. No nausea or vomiting. He has a good appetite.  Objective:  Vital signs in last 24 hours:  Blood pressure 133/86, pulse 72, temperature 97.9 F (36.6 C), temperature source Oral, resp. rate 18, height 5' 9" (1.753 m), weight 192 lb 6.4 oz (87.272 kg), SpO2 98 %.    HEENT: No thrush or ulcers. Lymphatics: No palpable cervical, supraclavicular, axillary or inguinal lymph nodes. Resp: Lungs clear bilaterally. Cardio: Regular rate and rhythm. GI: Abdomen soft and nontender. No hepatomegaly. Vascular: No leg edema.    Lab Results:  Lab Results  Component Value Date   WBC 4.3 03/31/2015   HGB 13.4 03/31/2015   HCT 41.1 03/31/2015   MCV 90.8 03/31/2015   PLT 275 03/31/2015   NEUTROABS 2.0 03/31/2015    Imaging:  No results found.  Medications: I have reviewed the patient's current medications.  Assessment/Plan: 1. Clinical stage II Rectal cancer, posterior rectal mass identified at 10-15 cm from the anal verge, a biopsy 03/31/2014 confirmed adenocarcinoma  Endoscopic ultrasound 04/03/2014 revealed a (uT3,uN0) lesion   Initiation of radiation/Xeloda 05/05/2014, completed 06/16/2014.  Low anterior resection with diverting loop ileostomy 08/29/2014. Pathology showed invasive adenocarcinoma arising in a background of tubular adenoma invading into but not through the muscularis propria. There was no evidence of angiolymphatic invasion or perineural invasion. There was one hyperplastic polyp with no adenomatous change or malignancy. One of 12 lymph nodes was positive for metastatic carcinoma. Resection margins were negative for atypia or malignancy. Pathologic staging pT2, pN1a.   Microsatellite stable; preserved expression of the major and minor MMR  proteins.  Cycle 1 adjuvant Xeloda 09/25/2014.  Cycle 2 adjuvant Xeloda 10/16/2014  Cycle 3 adjuvant Xeloda 11/06/2014  Cycle 4 adjuvant Xeloda 11/27/2014  Cycle 5 adjuvant Xeloda 12/17/2014  Ileostomy takedown procedure 01/30/2015  CTs 03/31/2015 with subtle nodularity along the left paracolic peritoneal reflection and omentum  CT abdomen/pelvis 10/05/2015 with improved to resolved nodularity at the left abdomen and paracolic gutter, no evidence of recurrent disease  Surveillance colonoscopy 02/01/2016. Prior end to end colo-colonic anastomosis rectosigmoid colon; patent, healthy appearing mucosa; exam otherwise without abnormality. Next colonoscopy at a 3 year interval. 2. Hypertension.   Disposition:Darren Hill remains in clinical remission from rectal cancer. We will follow-up on the CEA from today. He will return for an office visit in 6 months with labs and CT scans a few days prior. He will contact the office in the interim with any problems.    Ned Card ANP/GNP-BC   05/23/2016  9:46 AM

## 2016-05-24 LAB — CEA (PARALLEL TESTING): CEA: 1 ng/mL

## 2016-11-23 ENCOUNTER — Ambulatory Visit (HOSPITAL_COMMUNITY)
Admission: RE | Admit: 2016-11-23 | Discharge: 2016-11-23 | Disposition: A | Discharge status: Home / Self Care | Payer: Commercial Managed Care - PPO | Source: Ambulatory Visit | Attending: Nurse Practitioner | Admitting: Nurse Practitioner

## 2016-11-23 ENCOUNTER — Encounter (HOSPITAL_COMMUNITY): Payer: Self-pay

## 2016-11-23 ENCOUNTER — Other Ambulatory Visit (HOSPITAL_BASED_OUTPATIENT_CLINIC_OR_DEPARTMENT_OTHER): Payer: Commercial Managed Care - PPO

## 2016-11-23 DIAGNOSIS — C20 Malignant neoplasm of rectum: Secondary | ICD-10-CM | POA: Insufficient documentation

## 2016-11-23 DIAGNOSIS — C772 Secondary and unspecified malignant neoplasm of intra-abdominal lymph nodes: Secondary | ICD-10-CM | POA: Diagnosis present

## 2016-11-23 LAB — BASIC METABOLIC PANEL
ANION GAP: 9 meq/L (ref 3–11)
BUN: 12 mg/dL (ref 7.0–26.0)
CALCIUM: 9.3 mg/dL (ref 8.4–10.4)
CO2: 27 mEq/L (ref 22–29)
Chloride: 101 mEq/L (ref 98–109)
Creatinine: 1.4 mg/dL — ABNORMAL HIGH (ref 0.7–1.3)
EGFR: 69 mL/min/{1.73_m2} — AB (ref >90)
GLUCOSE: 99 mg/dL (ref 70–140)
Potassium: 4 mEq/L (ref 3.5–5.1)
Sodium: 137 mEq/L (ref 136–145)

## 2016-11-23 LAB — CEA (IN HOUSE-CHCC): CEA (CHCC-In House): <1 ng/mL (ref 0.00–5.00)

## 2016-11-23 MED ORDER — IOPAMIDOL (ISOVUE-300) INJECTION 61%
100.0000 mL | Freq: Once | INTRAVENOUS | Status: AC | PRN
  Administered 2016-11-23: 100 mL via INTRAVENOUS

## 2016-11-24 ENCOUNTER — Telehealth: Payer: Self-pay | Admitting: *Deleted

## 2016-11-24 LAB — CEA (PARALLEL TESTING): CEA: 0.8 ng/mL

## 2016-11-24 NOTE — Telephone Encounter (Signed)
Left message on voicemail informing pt that we need to reschedule his office visit. Requested he call office in the AM.

## 2016-11-25 ENCOUNTER — Ambulatory Visit: Payer: Commercial Managed Care - PPO | Admitting: Oncology

## 2016-11-29 ENCOUNTER — Encounter: Payer: Self-pay | Admitting: Oncology

## 2016-12-04 ENCOUNTER — Telehealth: Payer: Self-pay | Admitting: Oncology

## 2016-12-04 NOTE — Telephone Encounter (Signed)
S/w pt, gave appt 12/27/16 @ 9.30am.

## 2016-12-27 ENCOUNTER — Ambulatory Visit (HOSPITAL_BASED_OUTPATIENT_CLINIC_OR_DEPARTMENT_OTHER): Payer: Commercial Managed Care - PPO | Admitting: Oncology

## 2016-12-27 ENCOUNTER — Telehealth: Payer: Self-pay | Admitting: Oncology

## 2016-12-27 VITALS — BP 125/84 | HR 70 | Temp 97.8°F | Resp 18 | Ht 69.0 in | Wt 194.9 lb

## 2016-12-27 DIAGNOSIS — C20 Malignant neoplasm of rectum: Secondary | ICD-10-CM

## 2016-12-27 DIAGNOSIS — C772 Secondary and unspecified malignant neoplasm of intra-abdominal lymph nodes: Secondary | ICD-10-CM

## 2016-12-27 NOTE — Telephone Encounter (Signed)
Appointments scheduled per 12/27/16 los. Patient was given a copy of the appointment schedule and AVS report per 0/30/18 los. °

## 2016-12-27 NOTE — Progress Notes (Signed)
  [place] OFFICE PROGRESS NOTE   Diagnosis: Rectal cancer  INTERVAL HISTORY:   Darren Hill returns as scheduled. He feels well. No complaint. Good appetite. No difficulty with bowel function.  Objective:  Vital signs in last 24 hours:  Blood pressure 125/84, pulse 70, temperature 97.8 F (36.6 C), temperature source Oral, resp. rate 18, height _0  (1.753 m), weight 194 lb 14.4 oz (88.4 kg), SpO2 100 %.    HEENT: Neck without mass Lymphatics: No cervical, supraclavicular, axillary, or inguinal nodes Resp: Lungs clear bilaterally Cardio: Regular rate and rhythm GI: No hepatosplenomegaly, no mass, nontender Vascular: No leg edema   Lab Results:  Lab Results  Component Value Date   WBC 4.3 03/31/2015   HGB 13.4 03/31/2015   HCT 41.1 03/31/2015   MCV 90.8 03/31/2015   PLT 275 03/31/2015   NEUTROABS 2.0 03/31/2015   12.7 2017: CEA-less than 1  Medications: I have reviewed the patient's current medications.  Assessment/Plan: 1. Clinical stage II Rectal cancer, posterior rectal mass identified at 10-15 cm from the anal verge, a biopsy 03/31/2014 confirmed adenocarcinoma  Endoscopic ultrasound 04/03/2014 revealed a (uT3,uN0) lesion   Initiation of radiation/Xeloda 05/05/2014, completed 06/16/2014.  Low anterior resection with diverting loop ileostomy 08/29/2014. Pathology showed invasive adenocarcinoma arising in a background of tubular adenoma invading into but not through the muscularis propria. There was no evidence of angiolymphatic invasion or perineural invasion. There was one hyperplastic polyp with no adenomatous change or malignancy. One of 12 lymph nodes was positive for metastatic carcinoma. Resection margins were negative for atypia or malignancy. Pathologic staging pT2, pN1a.   Microsatellite stable; preserved expression of the major and minor MMR proteins.  Cycle 1 adjuvant Xeloda 09/25/2014.  Cycle 2 adjuvant Xeloda 10/16/2014  Cycle 3  adjuvant Xeloda 11/06/2014  Cycle 4 adjuvant Xeloda 11/27/2014  Cycle 5 adjuvant Xeloda 12/17/2014  Ileostomy takedown procedure 01/30/2015  CTs 03/31/2015 with subtle nodularity along the left paracolic peritoneal reflection and omentum  CT abdomen/pelvis 10/05/2015 with improved to resolved nodularity at the left abdomen and paracolic gutter, no evidence of recurrent disease  Surveillance colonoscopy 02/01/2016. Prior end to end colo-colonic anastomosis rectosigmoid colon; patent, healthy appearing mucosa; exam otherwise without abnormality. Next colonoscopy at a 3 year interval.  CTs 11/23/2016-negative for recurrent disease 2. Hypertension.    Disposition:  Darren Hill remains in clinical remission from rectal cancer. He will return for an office visit and CEA in 5 months. 15 minutes were spent with the patient today. The majority of the time was used for counseling and coordination of care.  12/27/2016  11:10 AM

## 2017-06-26 ENCOUNTER — Other Ambulatory Visit (HOSPITAL_BASED_OUTPATIENT_CLINIC_OR_DEPARTMENT_OTHER): Payer: No Typology Code available for payment source

## 2017-06-26 ENCOUNTER — Ambulatory Visit (HOSPITAL_BASED_OUTPATIENT_CLINIC_OR_DEPARTMENT_OTHER): Payer: No Typology Code available for payment source | Admitting: Nurse Practitioner

## 2017-06-26 ENCOUNTER — Telehealth: Payer: Self-pay

## 2017-06-26 VITALS — BP 118/75 | HR 58 | Temp 98.0°F | Resp 18 | Ht 69.0 in | Wt 195.1 lb

## 2017-06-26 DIAGNOSIS — C20 Malignant neoplasm of rectum: Secondary | ICD-10-CM

## 2017-06-26 DIAGNOSIS — C772 Secondary and unspecified malignant neoplasm of intra-abdominal lymph nodes: Principal | ICD-10-CM

## 2017-06-26 DIAGNOSIS — Z85048 Personal history of other malignant neoplasm of rectum, rectosigmoid junction, and anus: Secondary | ICD-10-CM

## 2017-06-26 LAB — CEA (IN HOUSE-CHCC)

## 2017-06-26 NOTE — Telephone Encounter (Signed)
appts made and avs was printed for patient

## 2017-06-26 NOTE — Progress Notes (Signed)
  [place] OFFICE PROGRESS NOTE   Diagnosis:  Rectal cancer  INTERVAL HISTORY:   Darren Hill returns as scheduled. He feels well. He has a good appetite and good energy level. No change in bowel habits. No bloody or black stools. He denies pain. No nausea or vomiting.  Objective:  Vital signs in last 24 hours:  Blood pressure 118/75, pulse (!) 58, temperature 98 F (36.7 C), temperature source Oral, resp. rate 18, height '5\' 9"'$  (1.753 m), weight 195 lb 1.6 oz (88.5 kg), SpO2 100 %.    HEENT: Neck without mass. Lymphatics: No palpable cervical, supra clavicular, axillary or inguinal lymph nodes. Resp: Lungs clear bilaterally. Cardio: Regular rate and rhythm. GI: Abdomen soft and nontender. No hepatosplenomegaly. Vascular: No leg edema.   Lab Results:  Lab Results  Component Value Date   WBC 4.3 03/31/2015   HGB 13.4 03/31/2015   HCT 41.1 03/31/2015   MCV 90.8 03/31/2015   PLT 275 03/31/2015   NEUTROABS 2.0 03/31/2015    Imaging:  No results found.  Medications: I have reviewed the patient's current medications.  Assessment/Plan: 1. Clinical stage II Rectal cancer, posterior rectal mass identified at 10-15 cm from the anal verge, a biopsy 03/31/2014 confirmed adenocarcinoma  Endoscopic ultrasound 04/03/2014 revealed a (uT3,uN0) lesion   Initiation of radiation/Xeloda 05/05/2014, completed 06/16/2014.  Low anterior resection with diverting loop ileostomy 08/29/2014. Pathology showed invasive adenocarcinoma arising in a background of tubular adenoma invading into but not through the muscularis propria. There was no evidence of angiolymphatic invasion or perineural invasion. There was one hyperplastic polyp with no adenomatous change or malignancy. One of 12 lymph nodes was positive for metastatic carcinoma. Resection margins were negative for atypia or malignancy. Pathologic staging pT2, pN1a.   Microsatellite stable; preserved expression of the major  and minor MMR proteins.  Cycle 1 adjuvant Xeloda 09/25/2014.  Cycle 2 adjuvant Xeloda 10/16/2014  Cycle 3 adjuvant Xeloda 11/06/2014  Cycle 4 adjuvant Xeloda 11/27/2014  Cycle 5 adjuvant Xeloda 12/17/2014  Ileostomy takedown procedure 01/30/2015  CTs 03/31/2015 with subtle nodularity along the left paracolic peritoneal reflection and omentum  CT abdomen/pelvis 10/05/2015 with improved to resolved nodularity at the left abdomen and paracolic gutter, no evidence of recurrent disease  Surveillance colonoscopy 02/01/2016. Prior end to end colo-colonic anastomosis rectosigmoid colon; patent, healthy appearing mucosa; exam otherwise without abnormality. Next colonoscopy at a 3 year interval.  CTs 11/23/2016-negative for recurrent disease 2. Hypertension.   Disposition: Darren Hill remains in clinical remission from rectal cancer. We will follow-up on the CEA from today. We are referring him for surveillance CT scans December 2018. He will return for a follow-up visit a few days later to review the results. He will contact the office in the interim with any problems.  Plan reviewed with Dr. Benay Spice.    Ned Card ANP/GNP-BC   06/26/2017  10:26 AM

## 2017-06-27 LAB — CEA (PARALLEL TESTING)

## 2017-11-13 ENCOUNTER — Encounter (HOSPITAL_COMMUNITY): Payer: Self-pay

## 2017-11-13 ENCOUNTER — Ambulatory Visit (HOSPITAL_COMMUNITY)
Admission: RE | Admit: 2017-11-13 | Discharge: 2017-11-13 | Disposition: A | Discharge status: Home / Self Care | Payer: PRIVATE HEALTH INSURANCE | Source: Ambulatory Visit | Attending: Nurse Practitioner | Admitting: Nurse Practitioner

## 2017-11-13 ENCOUNTER — Other Ambulatory Visit (HOSPITAL_BASED_OUTPATIENT_CLINIC_OR_DEPARTMENT_OTHER): Payer: No Typology Code available for payment source

## 2017-11-13 DIAGNOSIS — C20 Malignant neoplasm of rectum: Secondary | ICD-10-CM

## 2017-11-13 DIAGNOSIS — Z85048 Personal history of other malignant neoplasm of rectum, rectosigmoid junction, and anus: Secondary | ICD-10-CM | POA: Insufficient documentation

## 2017-11-13 DIAGNOSIS — Z8589 Personal history of malignant neoplasm of other organs and systems: Secondary | ICD-10-CM | POA: Diagnosis not present

## 2017-11-13 DIAGNOSIS — C772 Secondary and unspecified malignant neoplasm of intra-abdominal lymph nodes: Secondary | ICD-10-CM

## 2017-11-13 DIAGNOSIS — I700 Atherosclerosis of aorta: Secondary | ICD-10-CM | POA: Insufficient documentation

## 2017-11-13 DIAGNOSIS — Z923 Personal history of irradiation: Secondary | ICD-10-CM | POA: Insufficient documentation

## 2017-11-13 DIAGNOSIS — Z08 Encounter for follow-up examination after completed treatment for malignant neoplasm: Secondary | ICD-10-CM | POA: Insufficient documentation

## 2017-11-13 HISTORY — DX: Unspecified asthma, uncomplicated: J45.909

## 2017-11-13 LAB — BASIC METABOLIC PANEL
Anion Gap: 9 mEq/L (ref 3–11)
BUN: 12.5 mg/dL (ref 7.0–26.0)
CALCIUM: 9.5 mg/dL (ref 8.4–10.4)
CO2: 28 meq/L (ref 22–29)
CREATININE: 1.5 mg/dL — AB (ref 0.7–1.3)
Chloride: 98 mEq/L (ref 98–109)
EGFR: >60 mL/min/{1.73_m2} (ref >60)
GLUCOSE: 114 mg/dL (ref 70–140)
Potassium: 4 mEq/L (ref 3.5–5.1)
SODIUM: 135 meq/L — AB (ref 136–145)

## 2017-11-13 LAB — CEA (IN HOUSE-CHCC): CEA (CHCC-In House): <1 ng/mL (ref 0.00–5.00)

## 2017-11-13 MED ORDER — IOPAMIDOL (ISOVUE-300) INJECTION 61%
INTRAVENOUS | Status: AC
  Filled 2017-11-13: qty 100

## 2017-11-13 MED ORDER — IOPAMIDOL (ISOVUE-300) INJECTION 61%
100.0000 mL | Freq: Once | INTRAVENOUS | Status: AC | PRN
  Administered 2017-11-13: 100 mL via INTRAVENOUS

## 2017-11-14 ENCOUNTER — Other Ambulatory Visit: Payer: Self-pay | Admitting: Nurse Practitioner

## 2017-11-14 ENCOUNTER — Telehealth: Payer: Self-pay | Admitting: Oncology

## 2017-11-14 DIAGNOSIS — C772 Secondary and unspecified malignant neoplasm of intra-abdominal lymph nodes: Principal | ICD-10-CM

## 2017-11-14 DIAGNOSIS — C20 Malignant neoplasm of rectum: Secondary | ICD-10-CM

## 2017-11-14 NOTE — Telephone Encounter (Signed)
Scheduled appt per 12/18 sch msg - left voicemail for patient regarding appt that was added.

## 2017-11-15 ENCOUNTER — Telehealth: Payer: Self-pay | Admitting: Nurse Practitioner

## 2017-11-15 NOTE — Telephone Encounter (Signed)
-----   Message from Ladell Pier, MD sent at 11/14/2017  6:07 PM EST ----- Please let him know CT shows no evidence of cancer, increased inflammation/bronchiectasis in the lungs and stable thickening at the distal esophagus Ask him if he has respiratory or esophagus symptoms-dysphasia? ----- Message ----- From: Owens Shark, NP Sent: 11/14/2017   9:05 AM To: Ladell Pier, MD    ----- Message ----- From: Interface, Rad Results In Sent: 11/13/2017  10:41 AM To: Owens Shark, NP

## 2017-11-15 NOTE — Telephone Encounter (Signed)
I contacted Darren Hill to make him aware the CT scan shows no evidence of cancer.  There was some increased inflammation/bronchiectasis in the lungs and stable thickening at the distal esophagus.  He denies cough, shortness of breath.  He reports recent reflux symptoms which he attributes to his diet.  He will follow-up as scheduled.

## 2017-11-16 ENCOUNTER — Ambulatory Visit: Payer: No Typology Code available for payment source | Admitting: Oncology

## 2017-11-16 ENCOUNTER — Telehealth: Payer: Self-pay | Admitting: Emergency Medicine

## 2017-11-16 ENCOUNTER — Other Ambulatory Visit: Payer: Self-pay | Admitting: Nurse Practitioner

## 2017-11-16 DIAGNOSIS — C772 Secondary and unspecified malignant neoplasm of intra-abdominal lymph nodes: Principal | ICD-10-CM

## 2017-11-16 DIAGNOSIS — C20 Malignant neoplasm of rectum: Secondary | ICD-10-CM

## 2017-11-16 NOTE — Telephone Encounter (Addendum)
Pt verbalized understanding of this note.   ----- Message from Owens Shark, NP sent at 11/16/2017  8:44 AM EST ----- Please let Mr. Sexson know we made a referral to Dr. Amedeo Plenty to evaluate the reflux symptoms and esophageal thickening noted on recent CT.

## 2017-11-20 ENCOUNTER — Other Ambulatory Visit (HOSPITAL_BASED_OUTPATIENT_CLINIC_OR_DEPARTMENT_OTHER): Payer: No Typology Code available for payment source

## 2017-11-20 DIAGNOSIS — C20 Malignant neoplasm of rectum: Secondary | ICD-10-CM | POA: Diagnosis not present

## 2017-11-20 DIAGNOSIS — C772 Secondary and unspecified malignant neoplasm of intra-abdominal lymph nodes: Secondary | ICD-10-CM

## 2017-11-20 LAB — BASIC METABOLIC PANEL
Anion Gap: 8 mEq/L (ref 3–11)
BUN: 17 mg/dL (ref 7.0–26.0)
CALCIUM: 9 mg/dL (ref 8.4–10.4)
CHLORIDE: 99 meq/L (ref 98–109)
CO2: 27 meq/L (ref 22–29)
CREATININE: 1.5 mg/dL — AB (ref 0.7–1.3)
EGFR: >60 mL/min/{1.73_m2} (ref >60)
Glucose: 98 mg/dl (ref 70–140)
Potassium: 4.1 mEq/L (ref 3.5–5.1)
SODIUM: 134 meq/L — AB (ref 136–145)

## 2017-11-30 ENCOUNTER — Telehealth: Payer: Self-pay | Admitting: Oncology

## 2017-11-30 ENCOUNTER — Inpatient Hospital Stay: Payer: No Typology Code available for payment source | Attending: Oncology | Admitting: Oncology

## 2017-11-30 VITALS — BP 122/79 | HR 81 | Temp 97.9°F | Resp 20 | Ht 69.0 in | Wt 200.0 lb

## 2017-11-30 DIAGNOSIS — C20 Malignant neoplasm of rectum: Secondary | ICD-10-CM

## 2017-11-30 DIAGNOSIS — C772 Secondary and unspecified malignant neoplasm of intra-abdominal lymph nodes: Secondary | ICD-10-CM

## 2017-11-30 NOTE — Telephone Encounter (Signed)
Gave avs and calendar for July 2019

## 2017-11-30 NOTE — Progress Notes (Signed)
[place] OFFICE PROGRESS NOTE   Diagnosis: Rectal cancer  INTERVAL HISTORY:   Mr. Darren Hill returns as scheduled.  He feels well.  He has mild rectal urgency.  No respiratory symptoms.  He has a history of intermittent "heartburn" for years.  This occurs after eating certain foods and is relieved with "Tums ".  No dysphasia.  Objective:  Vital signs in last 24 hours:  Blood pressure 122/79, pulse 81, temperature 97.9 F (36.6 C), temperature source Oral, resp. rate 20, height 5' 9" (1.753 m), weight 200 lb (90.7 kg), SpO2 100 %.    HEENT: Neck without mass Lymphatics: No cervical, supraclavicular, axillary, or inguinal nodes Resp: Lungs clear bilaterally Cardio: Regular rate and rhythm GI: No hepatosplenomegaly, no mass, nontender Vascular: No leg edema   Lab Results:   CMP     Component Value Date/Time   NA 134 (L) 11/20/2017 0758   K 4.1 11/20/2017 0758   CL 102 01/31/2015 0505   CO2 27 11/20/2017 0758   GLUCOSE 98 11/20/2017 0758   BUN 17.0 11/20/2017 0758   CREATININE 1.5 (H) 11/20/2017 0758   CALCIUM 9.0 11/20/2017 0758   PROT 7.5 03/31/2015 1447   ALBUMIN 4.1 03/31/2015 1447   AST 24 03/31/2015 1447   ALT 16 03/31/2015 1447   ALKPHOS 66 03/31/2015 1447   BILITOT 0.44 03/31/2015 1447   GFRNONAA 69 (L) 01/31/2015 0505   GFRAA 80 (L) 01/31/2015 0505    Lab Results  Component Value Date   CEA1 <1.00 11/13/2017     Medications: I have reviewed the patient's current medications.   Assessment/Plan: 1. Clinical stage II Rectal cancer, posterior rectal mass identified at 10-15 cm from the anal verge, a biopsy 03/31/2014 confirmed adenocarcinoma  Endoscopic ultrasound 04/03/2014 revealed a (uT3,uN0) lesion   Initiation of radiation/Xeloda 05/05/2014, completed 06/16/2014.  Low anterior resection with diverting loop ileostomy 08/29/2014. Pathology showed invasive adenocarcinoma arising in a background of tubular adenoma invading into but  not through the muscularis propria. There was no evidence of angiolymphatic invasion or perineural invasion. There was one hyperplastic polyp with no adenomatous change or malignancy. One of 12 lymph nodes was positive for metastatic carcinoma. Resection margins were negative for atypia or malignancy. Pathologic staging pT2, pN1a.   Microsatellite stable; preserved expression of the major and minor MMR proteins.  Cycle 1 adjuvant Xeloda 09/25/2014.  Cycle 2 adjuvant Xeloda 10/16/2014  Cycle 3 adjuvant Xeloda 11/06/2014  Cycle 4 adjuvant Xeloda 11/27/2014  Cycle 5 adjuvant Xeloda 12/17/2014  Ileostomy takedown procedure 01/30/2015  CTs 03/31/2015 with subtle nodularity along the left paracolic peritoneal reflection and omentum  CT abdomen/pelvis 10/05/2015 with improved to resolved nodularity at the left abdomen and paracolic gutter, no evidence of recurrent disease  Surveillance colonoscopy 02/01/2016. Prior end to end colo-colonic anastomosis rectosigmoid colon; patent, healthy appearing mucosa; exam otherwise without abnormality. Next colonoscopy at a 3 year interval.  CTs 11/23/2016-negative for recurrent disease  CTs 11/13/2017-slight progression of inflammatory changes at the lingula, distal esophageal wall thickening, no evidence of metastatic disease 2. Hypertension.  Disposition: Mr. Darren Hill remains in clinical remission from rectal cancer.  He will return for an office visit and CEA in 6 months. I recommended he follow-up with GI for evaluation of the reflux symptoms and esophageal thickening noted on CT.  The creatinine is mildly elevated.  This may be related to hypertension or the antihypertensive regimen.  He should follow-up with Dr. Criss Rosales.  Betsy Coder, MD  11/30/2017  9:04 AM

## 2017-12-05 ENCOUNTER — Encounter: Payer: Self-pay | Admitting: Genetics

## 2017-12-07 ENCOUNTER — Telehealth: Payer: Self-pay | Admitting: Nurse Practitioner

## 2017-12-07 NOTE — Telephone Encounter (Signed)
I spoke to Mr. Dowe and made him aware that Dr. Benay Spice recommends he follow-up with Dr. Amedeo Plenty, GI to evaluate the esophageal thickening noted on the recent CT scan.

## 2018-01-19 IMAGING — CT CT ABD-PELV W/ CM
2 of 5 series · 14 of 46 positions shown, 16 images · IV contrast (iopamidol)
Comparison: 10/05/2015

CLINICAL DATA: Restaging colon cancer. Initial diagnosis 7954.
Status post colon resection, chemotherapy and radiation therapy.

EXAM:
CT CHEST, ABDOMEN, AND PELVIS WITH CONTRAST
TECHNIQUE: Multidetector CT imaging of the chest, abdomen and pelvis was
performed following the standard protocol during bolus
administration of intravenous contrast.
CONTRAST:  100mL [id number] IOPAMIDOL ([id number]) INJECTION 61%

[Series 2: abd/pel with · axial · 0.74mm/px · z∈[+1134,+1634]mm · 11 of 121 slices shown, 13 images]
[im 11/121  soft-tissue]
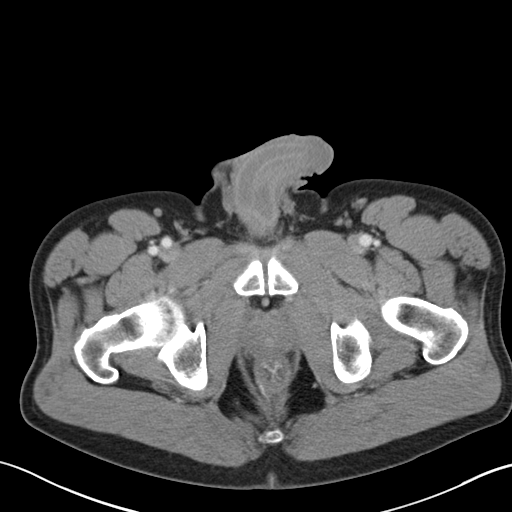
[im 11/121  bone]
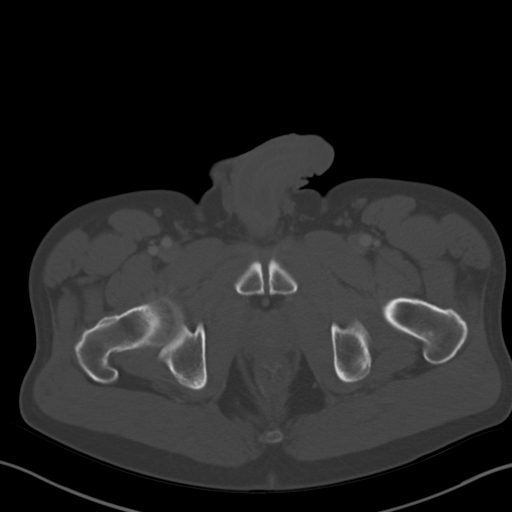
[im 21/121  soft-tissue]
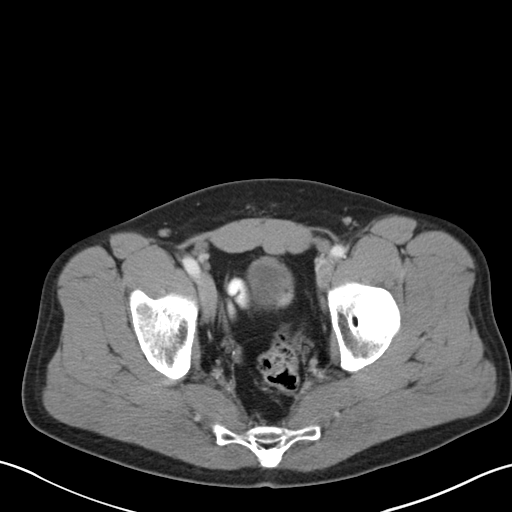
[im 31/121  soft-tissue]
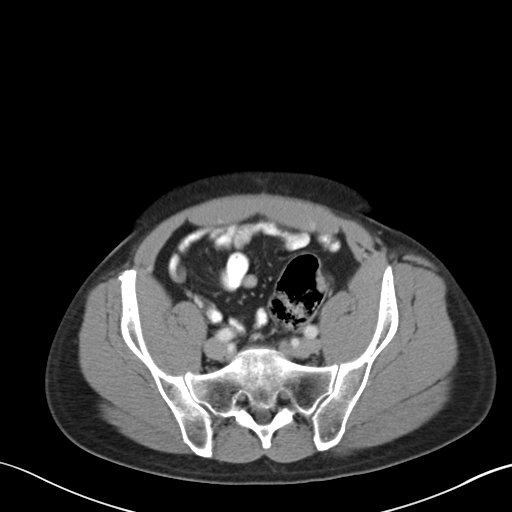
[im 41/121  soft-tissue]
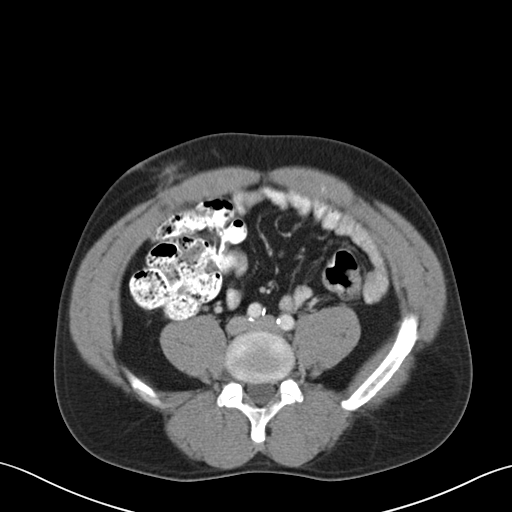
[im 51/121  soft-tissue]
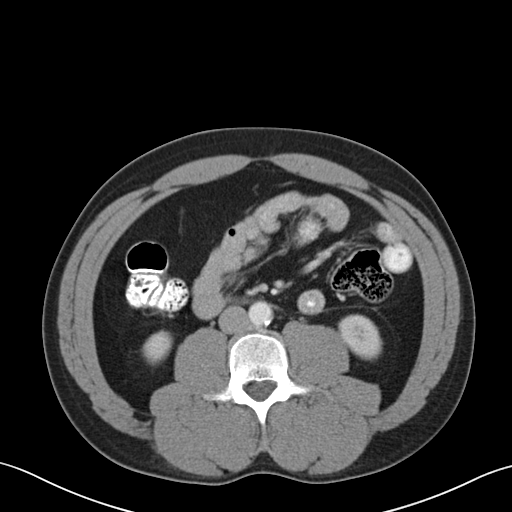
[im 61/121  soft-tissue]
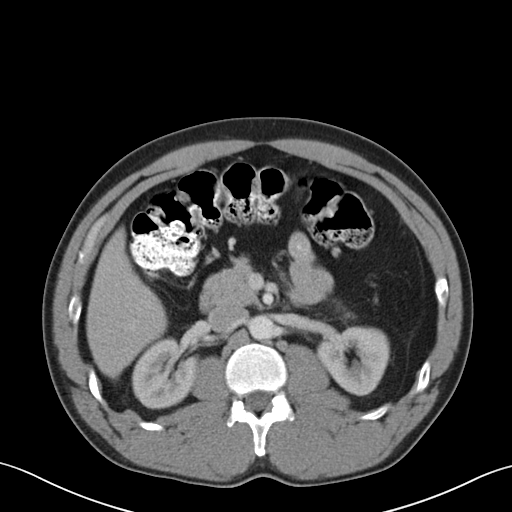
[im 71/121  soft-tissue]
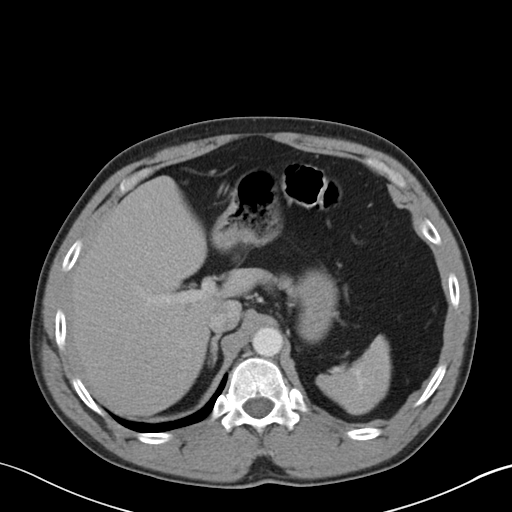
[im 81/121  soft-tissue]
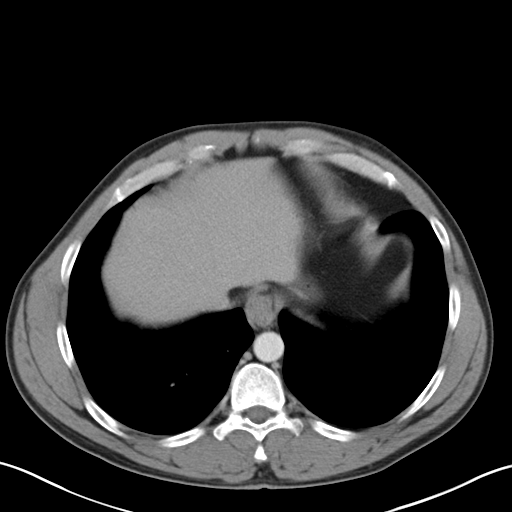
[im 91/121  soft-tissue]
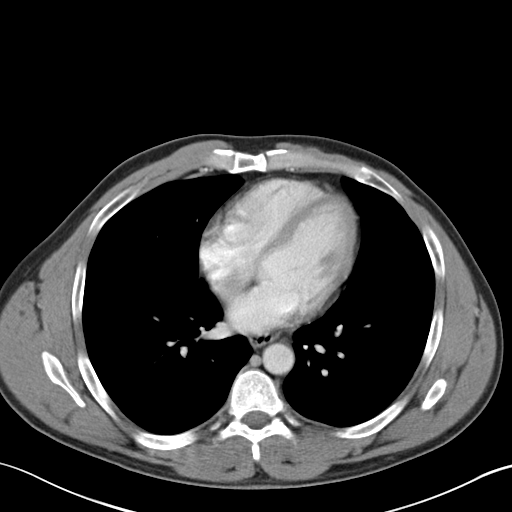
[im 91/121  bone]
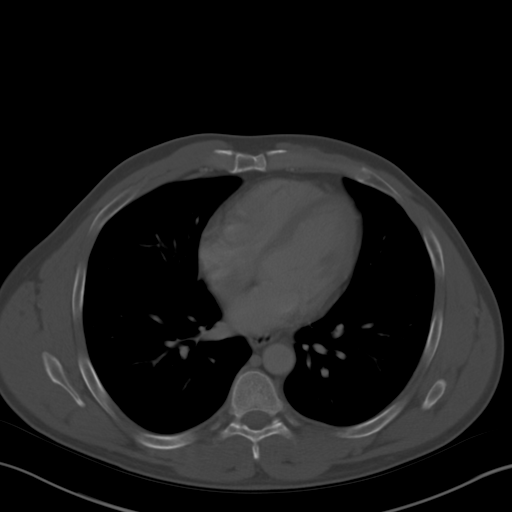
[im 101/121  soft-tissue]
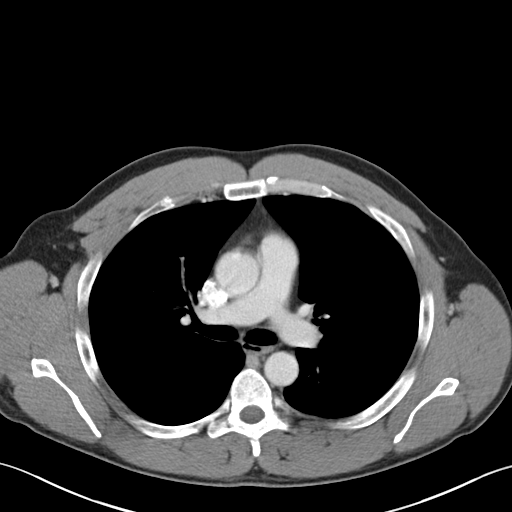
[im 111/121  soft-tissue]
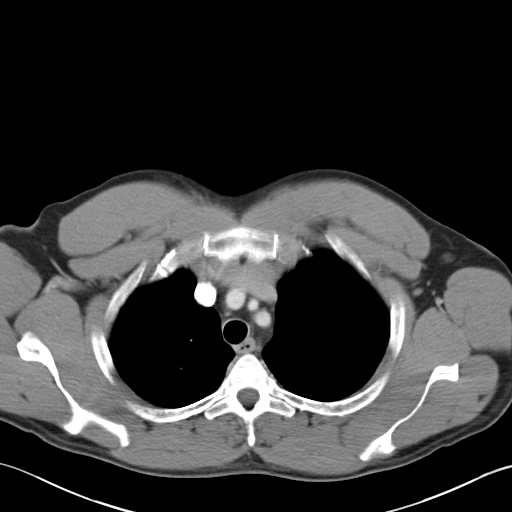

[Series 5: coronal a/|p · coronal · 0.74mm/px · 3 of 157 slices shown]
[im 53/157  soft-tissue]
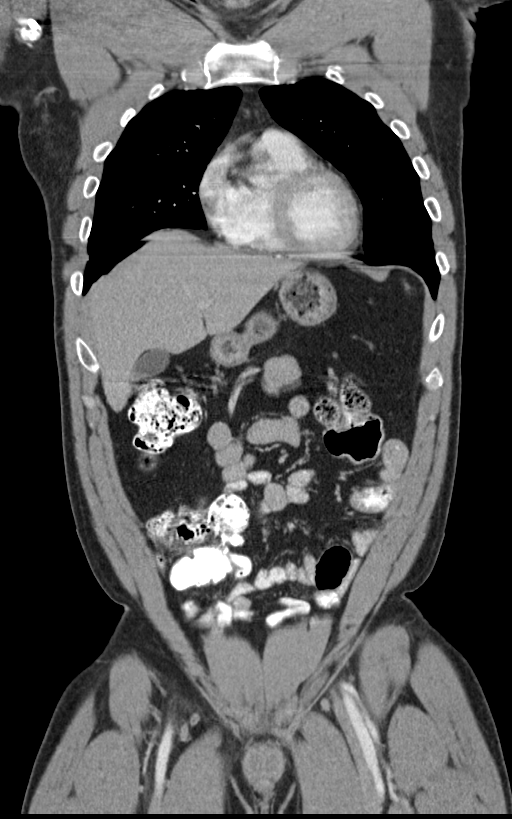
[im 70/157  soft-tissue]
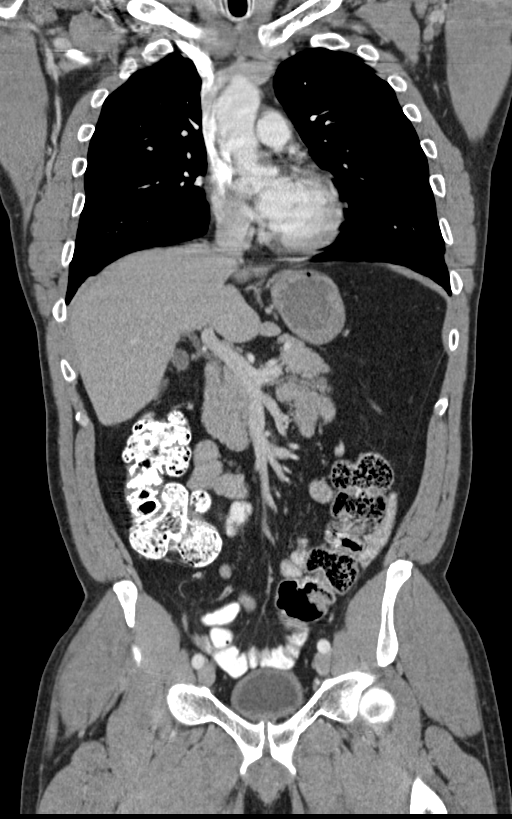
[im 87/157  soft-tissue]
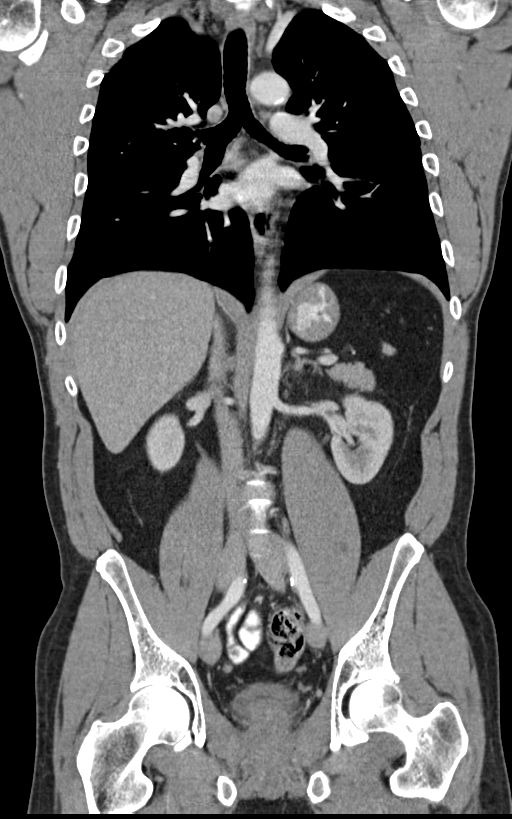

[14 of 46 positions shown; findings below may reference images not displayed]

FINDINGS: CT CHEST FINDINGS

Chest wall: No chest wall mass, supraclavicular or axillary
lymphadenopathy. Small scattered stable lymph nodes are noted. The
thyroid gland is normal.

Cardiovascular: The heart is normal in size. No pericardial
effusion. The aorta is normal in caliber. No dissection. The branch
vessels are patent. No coronary artery calcifications are
identified.

Mediastinum/Nodes: No mediastinal or hilar mass or adenopathy. The
esophagus is grossly normal.

Lungs/Pleura: No acute pulmonary findings. No worrisome pulmonary
lesions to suggest metastatic disease. No pleural effusion.

Musculoskeletal: No significant bony findings.

CT ABDOMEN PELVIS FINDINGS

Hepatobiliary: No focal hepatic lesions to suggest metastatic
disease. No evidence of peritoneal surface disease. Gallbladder is
normal. No common bile duct dilatation.

Pancreas: No mass, inflammation or ductal dilatation.

Spleen: Normal size.  No focal lesions.

Adrenals/Urinary Tract: The adrenal glands and kidneys are normal.

Stomach/Bowel: The stomach, duodenum, small bowel and colon are
unremarkable. No inflammatory changes, mass lesions or obstructive
findings. The terminal ileum is normal. The appendix is normal.
Stable surgical changes involving the rectum related to prior rectal
cancer resection. No findings for recurrent tumor.

Vascular/Lymphatic: The aorta and branch vessels are patent.
Atherosclerotic calcifications near the iliac artery bifurcation.
The major venous structures are patent. New line no mesenteric or
retroperitoneal mass or lymphadenopathy. No findings for peritoneal
surface or omental disease.

Reproductive: The prostate gland and seminal vesicles are
unremarkable.

Other: No pelvic mass or adenopathy. No free pelvic fluid
collections. No inguinal mass or adenopathy. Small scattered lymph
nodes are stable.

Musculoskeletal: No significant bony findings.
IMPRESSION: Stable surgical changes from a rectal cancer resection. No findings
for recurrent tumor, adenopathy or metastatic disease.

## 2018-03-01 ENCOUNTER — Ambulatory Visit (INDEPENDENT_AMBULATORY_CARE_PROVIDER_SITE_OTHER): Payer: No Typology Code available for payment source | Admitting: Podiatry

## 2018-03-01 DIAGNOSIS — B351 Tinea unguium: Secondary | ICD-10-CM

## 2018-03-01 DIAGNOSIS — Z79899 Other long term (current) drug therapy: Secondary | ICD-10-CM | POA: Diagnosis not present

## 2018-03-01 DIAGNOSIS — L600 Ingrowing nail: Secondary | ICD-10-CM | POA: Diagnosis not present

## 2018-03-01 LAB — CBC WITH DIFFERENTIAL/PLATELET
BASOS ABS: 42 {cells}/uL (ref 0–200)
Basophils Relative: 0.8 %
EOS PCT: 2.7 %
Eosinophils Absolute: 143 cells/uL (ref 15–500)
HEMATOCRIT: 38.9 % (ref 38.5–50.0)
Hemoglobin: 13.8 g/dL (ref 13.2–17.1)
Lymphs Abs: 1807 cells/uL (ref 850–3900)
MCH: 29.6 pg (ref 27.0–33.0)
MCHC: 35.5 g/dL (ref 32.0–36.0)
MCV: 83.3 fL (ref 80.0–100.0)
MONOS PCT: 10.8 %
MPV: 9.8 fL (ref 7.5–12.5)
NEUTROS PCT: 51.6 %
Neutro Abs: 2735 cells/uL (ref 1500–7800)
Platelets: 305 10*3/uL (ref 140–400)
RBC: 4.67 10*6/uL (ref 4.20–5.80)
RDW: 12.6 % (ref 11.0–15.0)
Total Lymphocyte: 34.1 %
WBC mixed population: 572 cells/uL (ref 200–950)
WBC: 5.3 10*3/uL (ref 3.8–10.8)

## 2018-03-01 LAB — HEPATIC FUNCTION PANEL
AG Ratio: 1.5 (calc) (ref 1.0–2.5)
ALKALINE PHOSPHATASE (APISO): 53 U/L (ref 40–115)
ALT: 14 U/L (ref 9–46)
AST: 25 U/L (ref 10–35)
Albumin: 4.4 g/dL (ref 3.6–5.1)
BILIRUBIN DIRECT: 0.1 mg/dL (ref 0.0–0.2)
BILIRUBIN TOTAL: 0.6 mg/dL (ref 0.2–1.2)
Globulin: 3 g/dL (calc) (ref 1.9–3.7)
Indirect Bilirubin: 0.5 mg/dL (calc) (ref 0.2–1.2)
Total Protein: 7.4 g/dL (ref 6.1–8.1)

## 2018-03-01 MED ORDER — TERBINAFINE HCL 250 MG PO TABS: 250.0000 mg | ORAL_TABLET | Freq: Every day | ORAL | 0 refills | Status: AC

## 2018-03-01 NOTE — Patient Instructions (Signed)

## 2018-03-06 NOTE — Progress Notes (Signed)
Subjective:   Patient ID: Darren Hill, male   DOB: 52 y.o.   MRN: [id number]   HPI 52 year old male presents the office today for concerns of toenail discoloration his nose with thick discolored.  Denies any pain to the nose and he denies any redness or drainage or any swelling to the toenail sites.  Said no recent treatment for this.  This is located to her nails become ingrown but currently not painful.  He has no other concerns today.   Review of Systems  All other systems reviewed and are negative.  Past Medical History:  Diagnosis Date  . Asthma   . CHEST PAIN 06/28/2010   Qualifier: Diagnosis of  By: Johnsie Cancel, MD, Rona Ravens   . Colon cancer ([place]) 04/03/14   rectal mass, colon ca  . GERD (gastroesophageal reflux disease)   . Hx of radiation therapy 05/06/14-06/16/14   rectum  . Hypertension   . Palpitations 06/28/2010   Qualifier: Diagnosis of  By: Johnsie Cancel, MD, Rona Ravens     Past Surgical History:  Procedure Laterality Date  . COLON BIOPSY N/A 04/03/14   rectal mass=adenocarcinoma  . DIVERTING ILEOSTOMY  08/29/14  . EUS N/A 04/03/2014   Procedure: LOWER ENDOSCOPIC ULTRASOUND (EUS);  Surgeon: Arta Silence, MD;  Location: [facility] ENDOSCOPY;  Service: Endoscopy;  Laterality: N/A;  H/P in file cabinet   . ILEOSTOMY CLOSURE N/A 01/30/2015   Procedure: TAKEDOWN OF LOOP ILEOSTOMY, EXAM UNDER ANESTHESIA;  Surgeon: Michael Boston, MD;  Location: [facility] ORS;  Service: General;  Laterality: N/A;     Current Outpatient Medications:  .  indapamide (LOZOL) 1.25 MG tablet, Take 1.25 mg by mouth daily., Disp: , Rfl:  .  lisinopril (PRINIVIL,ZESTRIL) 10 MG tablet, Take 10 mg by mouth every morning. , Disp: , Rfl:  .  terbinafine (LAMISIL) 250 MG tablet, Take 1 tablet (250 mg total) by mouth daily., Disp: 90 tablet, Rfl: 0  No Known Allergies  Social History   Socioeconomic History  . Marital status: Married    Spouse name: Not on file  . Number of children: 2  . Years of education:  Not on file  . Highest education level: Not on file  Occupational History  . Occupation: TRUCK DRIVER    Employer: [employer]  Social Needs  . Financial resource strain: Not on file  . Food insecurity:    Worry: Not on file    Inability: Not on file  . Transportation needs:    Medical: Not on file    Non-medical: Not on file  Tobacco Use  . Smoking status: Former Smoker    Packs/day: 0.25    Years: 15.00    Pack years: 3.75    Types: Cigarettes    Last attempt to quit: 04/03/2006    Years since quitting: 11.9  . Smokeless tobacco: Never Used  Substance and Sexual Activity  . Alcohol use: No  . Drug use: No  . Sexual activity: Yes  Lifestyle  . Physical activity:    Days per week: Not on file    Minutes per session: Not on file  . Stress: Not on file  Relationships  . Social connections:    Talks on phone: Not on file    Gets together: Not on file    Attends religious service: Not on file    Active member of club or organization: Not on file    Attends meetings of clubs or organizations: Not on file  Relationship status: Not on file  . Intimate partner violence:    Fear of current or ex partner: Not on file    Emotionally abused: Not on file    Physically abused: Not on file    Forced sexual activity: Not on file  Other Topics Concern  . Not on file  Social History Narrative  . Not on file          Objective:  Physical Exam  General: AAO x3, NAD  Dermatological: Nails appear to be hypertrophic, dystrophic, discolored with ill-defined discoloration.  There is no pain in the nails and there is no surrounding redness or drainage or any clinical signs of infection.  Mild incurvation of the nails but there is no signs of infection there is no pain at the nails.  No open lesions or pre-ulcerative lesions.  Vascular: Dorsalis Pedis artery and Posterior Tibial artery pedal pulses are 2/4 bilateral with immedate capillary fill time.  There is no pain with calf  compression, swelling, warmth, erythema.   Neruologic: Grossly intact via light touch bilateral.  Protective threshold with Semmes Wienstein monofilament intact to all pedal sites bilateral.   Musculoskeletal: No gross boney pedal deformities bilateral. No pain, crepitus, or limitation noted with foot and ankle range of motion bilateral. Muscular strength 5/5 in all groups tested bilateral.  Gait: Unassisted, Nonantalgic.       Assessment:   Onychomycosis with asymptomatic uterine toenails    Plan:  -Treatment options discussed including all alternatives, risks, and complications -Etiology of symptoms were discussed -This is a great toenails are a symptomatic we will hold off on partial nail avulsion.  Discussed proper nail trimming techniques.  We discussed treatment options for nail fungus.  After discussion he wishes to proceed with oral therapy.  I prescribed Lamisil but will check a CBC and LFT prior to starting medication he is not to start cycle him and he understands this.  Discussed side effects as well as excess rates.  This is not a guarantee but he understands.  Also within 6 weeks after starting medication or sooner if needed.  Encouraged to call with questions or concerns and he agrees with this plan.  Trula Slade DPM

## 2018-04-12 ENCOUNTER — Encounter: Payer: Self-pay | Admitting: Podiatry

## 2018-04-12 ENCOUNTER — Ambulatory Visit (INDEPENDENT_AMBULATORY_CARE_PROVIDER_SITE_OTHER): Payer: No Typology Code available for payment source | Admitting: Podiatry

## 2018-04-12 DIAGNOSIS — Z79899 Other long term (current) drug therapy: Secondary | ICD-10-CM

## 2018-04-12 DIAGNOSIS — B351 Tinea unguium: Secondary | ICD-10-CM | POA: Diagnosis not present

## 2018-04-12 NOTE — Progress Notes (Signed)
Subjective: 52 year old male presents the office today for follow-up evaluation of nail fungus.  He states that he is completing one bottle of the Lamisil he states he is had no complications.  He states he is starting to see some improvement in the toenails.  Denies any pain in the nails and denies any redness or drainage or any swelling. Denies any systemic complaints such as fevers, chills, nausea, vomiting. No acute changes since last appointment, and no other complaints at this time.   Objective: AAO x3, NAD DP/PT pulses palpable bilaterally, CRT less than 3 seconds Overall the nails appear to be hypertrophic, dystrophic, discolored with ill-defined discoloration mostly has bilateral hallux toenails.  There is no pain in the nails there is no surrounding redness or drainage or any signs of infection. No open lesions or pre-ulcerative lesions.  No pain with calf compression, swelling, warmth, erythema  Assessment: Onychomycosis, currently on Lamisil  Plan: -All treatment options discussed with the patient including all alternatives, risks, complications.  -He has completed 1 month of Lamisil he started to notice mild improvement.  Continue to monitor any side effects.  We will recheck a CBC and LFT today.  Continue with Lamisil for a total of 90 days.  I will see him back in the end of the course or sooner if any issues are to arise.  Discussed may be extended Lamisil if needed. -Patient encouraged to call the office with any questions, concerns, change in symptoms.   Trula Slade DPM

## 2018-04-14 LAB — CBC WITH DIFFERENTIAL/PLATELET
Basophils Absolute: 40 cells/uL (ref 0–200)
Basophils Relative: 0.9 %
Eosinophils Absolute: 119 cells/uL (ref 15–500)
Eosinophils Relative: 2.7 %
HEMATOCRIT: 39.1 % (ref 38.5–50.0)
Hemoglobin: 13.4 g/dL (ref 13.2–17.1)
Lymphs Abs: 1852 cells/uL (ref 850–3900)
MCH: 29.1 pg (ref 27.0–33.0)
MCHC: 34.3 g/dL (ref 32.0–36.0)
MCV: 84.8 fL (ref 80.0–100.0)
MONOS PCT: 11.6 %
MPV: 9.7 fL (ref 7.5–12.5)
NEUTROS ABS: 1879 {cells}/uL (ref 1500–7800)
Neutrophils Relative %: 42.7 %
PLATELETS: 300 10*3/uL (ref 140–400)
RBC: 4.61 10*6/uL (ref 4.20–5.80)
RDW: 13 % (ref 11.0–15.0)
Total Lymphocyte: 42.1 %
WBC mixed population: 510 cells/uL (ref 200–950)
WBC: 4.4 10*3/uL (ref 3.8–10.8)

## 2018-04-14 LAB — HEPATIC FUNCTION PANEL
AG RATIO: 1.6 (calc) (ref 1.0–2.5)
ALBUMIN MSPROF: 4.3 g/dL (ref 3.6–5.1)
ALKALINE PHOSPHATASE (APISO): 48 U/L (ref 40–115)
ALT: 10 U/L (ref 9–46)
AST: 19 U/L (ref 10–35)
Bilirubin, Direct: 0.1 mg/dL (ref 0.0–0.2)
GLOBULIN: 2.7 g/dL (ref 1.9–3.7)
Indirect Bilirubin: 0.3 mg/dL (calc) (ref 0.2–1.2)
TOTAL PROTEIN: 7 g/dL (ref 6.1–8.1)
Total Bilirubin: 0.4 mg/dL (ref 0.2–1.2)

## 2018-04-20 ENCOUNTER — Telehealth: Payer: Self-pay | Admitting: *Deleted

## 2018-04-20 NOTE — Telephone Encounter (Signed)
-----   Message from Trula Slade, DPM sent at 04/19/2018  9:08 PM EDT ----- Blood work is normal. Can continue Lamisil. Val can you let him know. If the nails are not improved once he finishes let him know to come back in. Thanks.

## 2018-04-20 NOTE — Telephone Encounter (Signed)
Left message informing pt of Dr. Wagoner's review of results and orders. 

## 2018-05-30 ENCOUNTER — Other Ambulatory Visit: Payer: No Typology Code available for payment source

## 2018-05-30 ENCOUNTER — Ambulatory Visit: Payer: No Typology Code available for payment source | Admitting: Nurse Practitioner

## 2018-06-05 ENCOUNTER — Inpatient Hospital Stay: Payer: No Typology Code available for payment source | Attending: Oncology

## 2018-06-05 ENCOUNTER — Encounter: Payer: Self-pay | Admitting: Nurse Practitioner

## 2018-06-05 ENCOUNTER — Telehealth: Payer: Self-pay | Admitting: Nurse Practitioner

## 2018-06-05 ENCOUNTER — Inpatient Hospital Stay (HOSPITAL_BASED_OUTPATIENT_CLINIC_OR_DEPARTMENT_OTHER): Payer: No Typology Code available for payment source | Admitting: Nurse Practitioner

## 2018-06-05 VITALS — BP 120/93 | HR 72 | Temp 97.8°F | Resp 18 | Ht 69.0 in | Wt 193.6 lb

## 2018-06-05 DIAGNOSIS — Z85048 Personal history of other malignant neoplasm of rectum, rectosigmoid junction, and anus: Secondary | ICD-10-CM | POA: Insufficient documentation

## 2018-06-05 DIAGNOSIS — C772 Secondary and unspecified malignant neoplasm of intra-abdominal lymph nodes: Secondary | ICD-10-CM

## 2018-06-05 DIAGNOSIS — C20 Malignant neoplasm of rectum: Secondary | ICD-10-CM

## 2018-06-05 LAB — BASIC METABOLIC PANEL
ANION GAP: 7 (ref 5–15)
BUN: 14 mg/dL (ref 6–20)
CO2: 29 mmol/L (ref 22–32)
Calcium: 9.7 mg/dL (ref 8.9–10.3)
Chloride: 100 mmol/L (ref 98–111)
Creatinine, Ser: 1.55 mg/dL — ABNORMAL HIGH (ref 0.61–1.24)
GFR calc Af Amer: 59 mL/min — ABNORMAL LOW (ref >60)
GFR, EST NON AFRICAN AMERICAN: 51 mL/min — AB (ref >60)
GLUCOSE: 100 mg/dL — AB (ref 70–99)
Potassium: 3.8 mmol/L (ref 3.5–5.1)
Sodium: 136 mmol/L (ref 135–145)

## 2018-06-05 LAB — CEA (IN HOUSE-CHCC): CEA (CHCC-In House): <1 ng/mL (ref 0.00–5.00)

## 2018-06-05 NOTE — Progress Notes (Signed)
  [facility] OFFICE PROGRESS NOTE   Diagnosis: Rectal cancer  INTERVAL HISTORY:   [facility] returns as scheduled.  He feels well.  No change in bowel habits.  No bleeding.  He reports a good appetite and good energy level.  No pain.  He is no longer having reflux symptoms.  Objective:  Vital signs in last 24 hours:  Blood pressure (!) 120/93, pulse 72, temperature 97.8 F (36.6 C), temperature source Oral, resp. rate 18, height _0  (1.753 m), weight 193 lb 9.6 oz (87.8 kg), SpO2 100 %.    HEENT: Neck without mass. Lymphatics: No palpable cervical, supraclavicular, axillary or inguinal lymph nodes. Resp: Lungs clear bilaterally. Cardio: Regular rate and rhythm. GI: Abdomen soft and nontender.  No hepatomegaly.   Vascular: No leg edema.    Lab Results:  Lab Results  Component Value Date   WBC 4.4 04/13/2018   HGB 13.4 04/13/2018   HCT 39.1 04/13/2018   MCV 84.8 04/13/2018   PLT 300 04/13/2018   NEUTROABS 1,879 04/13/2018    Imaging:  No results found.  Medications: I have reviewed the patient's current medications.  Assessment/Plan: 1. Clinical stage II Rectal cancer, posterior rectal mass identified at 10-15 cm from the anal verge, a biopsy 03/31/2014 confirmed adenocarcinoma  Endoscopic ultrasound 04/03/2014 revealed a (uT3,uN0) lesion   Initiation of radiation/Xeloda 05/05/2014, completed 06/16/2014.  Low anterior resection with diverting loop ileostomy 08/29/2014. Pathology showed invasive adenocarcinoma arising in a background of tubular adenoma invading into but not through the muscularis propria. There was no evidence of angiolymphatic invasion or perineural invasion. There was one hyperplastic polyp with no adenomatous change or malignancy. One of 12 lymph nodes was positive for metastatic carcinoma. Resection margins were negative for atypia or malignancy. Pathologic staging pT2, pN1a.   Microsatellite stable; preserved expression of the  major and minor MMR proteins.  Cycle 1 adjuvant Xeloda 09/25/2014.  Cycle 2 adjuvant Xeloda 10/16/2014  Cycle 3 adjuvant Xeloda 11/06/2014  Cycle 4 adjuvant Xeloda 11/27/2014  Cycle 5 adjuvant Xeloda 12/17/2014  Ileostomy takedown procedure 01/30/2015  CTs 03/31/2015 with subtle nodularity along the left paracolic peritoneal reflection and omentum  CT abdomen/pelvis 10/05/2015 with improved to resolved nodularity at the left abdomen and paracolic gutter, no evidence of recurrent disease  Surveillance colonoscopy 02/01/2016. Prior end to end colo-colonic anastomosis rectosigmoid colon; patent, healthy appearing mucosa; exam otherwise without abnormality. Next colonoscopy at a 3 year interval.  CTs 11/23/2016-negative for recurrent disease  CTs 11/13/2017-slight progression of inflammatory changes at the lingula, distal esophageal wall thickening, no evidence of metastatic disease 2. Hypertension.     Disposition: Dar[facility] Hill remains in clinical remission from rectal cancer.  We will follow-up on the CEA from today.  We again discussed the esophageal wall thickening noted on CT 11/13/2017.  He declines further endoscopic evaluation.  I suggested he discuss this with GI at the time of his next colonoscopy which will be around March 2020.  He will return for lab and follow-up in 6 months.  He will contact the office in the interim with any problems.  Plan reviewed with Dr. Benay Spice.    Ned Card ANP/GNP-BC   06/05/2018  9:39 AM

## 2018-06-05 NOTE — Telephone Encounter (Signed)
Scheduled appt per  7/9 los - gave patient AVS And calender per los.  

## 2018-06-12 ENCOUNTER — Ambulatory Visit (INDEPENDENT_AMBULATORY_CARE_PROVIDER_SITE_OTHER): Payer: No Typology Code available for payment source | Admitting: Podiatry

## 2018-06-12 ENCOUNTER — Encounter: Payer: Self-pay | Admitting: Podiatry

## 2018-06-12 DIAGNOSIS — L600 Ingrowing nail: Secondary | ICD-10-CM

## 2018-06-12 DIAGNOSIS — M79676 Pain in unspecified toe(s): Secondary | ICD-10-CM | POA: Diagnosis not present

## 2018-06-12 NOTE — Patient Instructions (Signed)

## 2018-06-13 NOTE — Progress Notes (Signed)
Subjective: 52 year old male presents the office today for concerns of right toenail thickening causing pain on the nail corners he was at the toenail taken off.  He has finished a course of Lamisil patient the other nails done well with the right hallux toenail has not changed and is still painful and thick and denies any redness or drainage or any swelling. Denies any systemic complaints such as fevers, chills, nausea, vomiting. No acute changes since last appointment, and no other complaints at this time.   Objective: AAO x3, NAD DP/PT pulses palpable bilaterally, CRT less than 3 seconds Overall the nails other than the right hallux toenail appear to be improved to the right hallux toenails hypertrophic, dystrophic, discolored and there is incurvation of both the medial lateral corners.  There is also tenderness palpation to the nail corners as well as the central portion of the nail.  Minimal edema to the nail plates there is no erythema or increase in warmth.  There is no ascending cellulitis.  There is no malodor. No open lesions or pre-ulcerative lesions.  No pain with calf compression, swelling, warmth, erythema  Assessment: Right hallux onychodystrophy, and her toenails  Plan: -All treatment options discussed with the patient including all alternatives, risks, complications.  -.At this time, the patient is requesting total nail removal with chemical matricectomy to the symptomatic nail. Risks and complications were discussed with the patient for which they understand and written consent was obtained. Under sterile conditions a total of 3 mL of a mixture of 2% lidocaine plain and 0.5% Marcaine plain was infiltrated in a hallux block fashion. Once anesthetized, the skin was prepped in sterile fashion. A tourniquet was then applied. Next the right hallux toenail was then sharply excised making sure to remove the entire offending nail border. Once the nails were ensured to be removed area was  debrided and the underlying skin was intact. There is no purulence identified in the procedure. Next phenol was then applied under standard conditions and copiously irrigated. Silvadene was applied. A dry sterile dressing was applied. After application of the dressing the tourniquet was removed and there is found to be an immediate capillary refill time to the digit. The patient tolerated the procedure well any complications. Post procedure instructions were discussed the patient for which he verbally understood. Follow-up in one week for nail check or sooner if any problems are to arise. Discussed signs/symptoms of infection and directed to call the office immediately should any occur or go directly to the emergency room. In the meantime, encouraged to call the office with any questions, concerns, changes symptoms. -Patient encouraged to call the office with any questions, concerns, change in symptoms.   Trula Slade DPM

## 2018-06-19 ENCOUNTER — Ambulatory Visit (INDEPENDENT_AMBULATORY_CARE_PROVIDER_SITE_OTHER): Payer: Self-pay

## 2018-06-19 DIAGNOSIS — R195 Other fecal abnormalities: Secondary | ICD-10-CM | POA: Insufficient documentation

## 2018-06-19 DIAGNOSIS — L600 Ingrowing nail: Secondary | ICD-10-CM

## 2018-06-19 NOTE — Patient Instructions (Signed)
Epsom Salt Soak Instructions    IF SOAKING IS STILL NECESSARY, START THIS 2 WEEKS AFTER INITIAL PROCEDURE   Place 1/4 cup of epsom salts in a quart of warm tap water.  Soak your foot or feet in the solution for 20 minutes twice a day until you notice the area has dried and a scab has formed. Continue to apply other medications to the area as directed by the doctor such as polysporin, neosporin or cortisporin drops.  IF YOUR SKIN BECOMES IRRITATED WHILE USING THESE INSTRUCTIONS, IT IS OKAY TO SWITCH TO  WHITE VINEGAR AND WATER. Or you may use antibacterial soap and water to keep the toe clean  Monitor for any signs/symptoms of infection. Call the office immediately if any occur or go directly to the emergency room. Call with any questions/concerns. Soak until there is no drainage, pain or redness. Bandage on during the day and off at night.

## 2018-06-19 NOTE — Progress Notes (Signed)
Patient presents s/p complete nail avulsion hallux right foot DOS 06/12/18. He states that his toe is still a little sore at times.   He presents today with no bandage on his toe and says that he only soaked his toe one time. He said he has a hard time soaking his toe because he works a lot. 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   Noted well healing right hallux nail. Some clear/yellow drainage was noted on toe with scabbing at the base. Slight maceration on the lateral side of the toe. No redness, no erythema. I did advise him to start soaking with Epsom salt soaks, bandage the toe with neosporin, leave bandage on during the day and off at night. I did stress to him the importance of covering and protecting the toe when working. I did spend a great deal of time discussing the importance of soaking and keeping area protected. Advised him on s/s of infection and to report any acute symptom changes to our office right away. Follow up with any changes or concerns.

## 2018-09-13 ENCOUNTER — Other Ambulatory Visit: Payer: Self-pay | Admitting: Family Medicine

## 2018-09-13 ENCOUNTER — Ambulatory Visit
Admission: RE | Admit: 2018-09-13 | Discharge: 2018-09-13 | Disposition: A | Discharge status: Home / Self Care | Payer: No Typology Code available for payment source | Source: Ambulatory Visit | Attending: Family Medicine | Admitting: Family Medicine

## 2018-09-13 DIAGNOSIS — R0989 Other specified symptoms and signs involving the circulatory and respiratory systems: Principal | ICD-10-CM

## 2018-09-13 DIAGNOSIS — R0689 Other abnormalities of breathing: Secondary | ICD-10-CM

## 2018-09-18 ENCOUNTER — Ambulatory Visit: Payer: No Typology Code available for payment source

## 2018-09-18 ENCOUNTER — Ambulatory Visit (INDEPENDENT_AMBULATORY_CARE_PROVIDER_SITE_OTHER): Payer: No Typology Code available for payment source | Admitting: Podiatry

## 2018-09-18 ENCOUNTER — Encounter: Payer: Self-pay | Admitting: Podiatry

## 2018-09-18 ENCOUNTER — Ambulatory Visit (INDEPENDENT_AMBULATORY_CARE_PROVIDER_SITE_OTHER): Payer: No Typology Code available for payment source

## 2018-09-18 DIAGNOSIS — M79671 Pain in right foot: Secondary | ICD-10-CM

## 2018-09-18 DIAGNOSIS — M79672 Pain in left foot: Secondary | ICD-10-CM | POA: Diagnosis not present

## 2018-09-18 DIAGNOSIS — M722 Plantar fascial fibromatosis: Secondary | ICD-10-CM | POA: Diagnosis not present

## 2018-09-18 MED ORDER — METHYLPREDNISOLONE 4 MG PO TBPK: ORAL_TABLET | ORAL | 0 refills | Status: DC

## 2018-09-18 NOTE — Patient Instructions (Signed)

## 2018-09-23 NOTE — Progress Notes (Signed)
Subjective: 52 year old male presents the office today for concerns of pain to both of his heels which is been ongoing for the last several weeks.  He states the left side is worse than the right.  This morning when he first gets up after doing prolonged walking.  He states he does not want injections into his heel.  Denies any recent injury or trauma.  Describes a throbbing sensation in the bottom of his heel.  He is tried different cushions inside of his shoes which helps some.  No numbness or tunneling.  The pain does not wake him up at night. Denies any systemic complaints such as fevers, chills, nausea, vomiting. No acute changes since last appointment, and no other complaints at this time.   Objective: AAO x3, NAD DP/PT pulses palpable bilaterally, CRT less than 3 seconds Tenderness to palpation along the plantar medial tubercle of the calcaneus at the insertion of plantar fascia on the left > right foot. There is no pain along the course of the plantar fascia within the arch of the foot. Plantar fascia appears to be intact. There is no pain with lateral compression of the calcaneus or pain with vibratory sensation. There is no pain along the course or insertion of the achilles tendon. No other areas of tenderness to bilateral lower extremities. No open lesions or pre-ulcerative lesions.  No pain with calf compression, swelling, warmth, erythema  Assessment: Heel pain bilaterally, plantar fasciitis  Plan: -All treatment options discussed with the patient including all alternatives, risks, complications.  -X-rays were obtained and reviewed with the patient.  No evidence of acute fracture or stress fracture. -He was to hold off on steroid injection today.  Medrol Dosepak prescribed. -Plantar fascial braces dispensed x2. -Stretching, icing exercises daily. -Discussed shoe modifications and orthotics -Patient encouraged to call the office with any questions, concerns, change in symptoms.    Trula Slade DPM

## 2018-10-30 ENCOUNTER — Ambulatory Visit (INDEPENDENT_AMBULATORY_CARE_PROVIDER_SITE_OTHER): Payer: No Typology Code available for payment source | Admitting: Podiatry

## 2018-10-30 ENCOUNTER — Encounter: Payer: Self-pay | Admitting: Podiatry

## 2018-10-30 DIAGNOSIS — M79672 Pain in left foot: Secondary | ICD-10-CM | POA: Diagnosis not present

## 2018-10-30 DIAGNOSIS — M79671 Pain in right foot: Secondary | ICD-10-CM

## 2018-10-30 DIAGNOSIS — M722 Plantar fascial fibromatosis: Secondary | ICD-10-CM

## 2018-10-30 NOTE — Progress Notes (Signed)
Subjective: [facility] presents to the office today for follow evaluation of bilateral heel pain, plantar fasciitis.  He states he is doing much better overall he is having no pain.  She has stopped wearing the plantar fascial braces he has not had any pain over the last week.  He has no swelling no recent injury or any changes since I last saw him. Denies any systemic complaints such as fevers, chills, nausea, vomiting. No acute changes since last appointment, and no other complaints at this time.   Objective: AAO x3, NAD DP/PT pulses palpable bilaterally, CRT less than 3 seconds At this time there is very minimal tenderness palpation on the left side of the plantar medial tubercle of the calcaneus at the insertion of plantar fracture but overall he is doing much better.  No pain on the right side.  There is no edema, erythema.  Negative Tinel sign. No open lesions or pre-ulcerative lesions.  No pain with calf compression, swelling, warmth, erythema  Assessment: Resolving plantar fasciitis  Plan: -All treatment options discussed with the patient including all alternatives, risks, complications.  -Overall he is doing much better.  Continue with stretching, icing daily as well as wearing supportive shoes.  I also gave him a coupon for [facility] to get OTC inserts.  He can start to wean off the plantar fascial braces as he has not been wearing the having no pain.  Discussed measures to help prevent recurrence. -Patient encouraged to call the office with any questions, concerns, change in symptoms.   Trula Slade DPM

## 2018-12-04 ENCOUNTER — Inpatient Hospital Stay: Payer: PRIVATE HEALTH INSURANCE

## 2018-12-04 ENCOUNTER — Telehealth: Payer: Self-pay | Admitting: Oncology

## 2018-12-04 ENCOUNTER — Inpatient Hospital Stay: Payer: PRIVATE HEALTH INSURANCE | Attending: Oncology | Admitting: Oncology

## 2018-12-04 VITALS — BP 118/82 | HR 74 | Temp 97.6°F | Resp 17 | Ht 72.0 in | Wt 201.7 lb

## 2018-12-04 DIAGNOSIS — C20 Malignant neoplasm of rectum: Secondary | ICD-10-CM

## 2018-12-04 DIAGNOSIS — I10 Essential (primary) hypertension: Secondary | ICD-10-CM | POA: Insufficient documentation

## 2018-12-04 DIAGNOSIS — C772 Secondary and unspecified malignant neoplasm of intra-abdominal lymph nodes: Secondary | ICD-10-CM | POA: Diagnosis present

## 2018-12-04 LAB — CEA (IN HOUSE-CHCC)

## 2018-12-04 NOTE — Progress Notes (Signed)
  [facility] OFFICE PROGRESS NOTE   Diagnosis: Rectal cancer  INTERVAL HISTORY:   [facility] returns as scheduled.  He feels well.  Good appetite and energy level.  No difficulty with bowel function.  He has intermittent sinus drainage.  Objective:  Vital signs in last 24 hours:  Blood pressure 118/82, pulse 74, temperature 97.6 F (36.4 C), temperature source Oral, resp. rate 17, height 6' (1.829 m), weight 201 lb 11.2 oz (91.5 kg), SpO2 99 %.    HEENT: Neck without mass Lymphatics: No cervical, supraclavicular, axillary, or inguinal nodes Resp: Lungs with mild bilateral end inspiratory and expiratory wheezes that cleared after several respirations, no respiratory distress Cardio: Regular rate and rhythm GI: No hepatosplenomegaly, no mass, nontender Vascular: Leg edema  Lab Results:   CMP  Lab Results  Component Value Date   NA 136 06/05/2018   K 3.8 06/05/2018   CL 100 06/05/2018   CO2 29 06/05/2018   GLUCOSE 100 (H) 06/05/2018   BUN 14 06/05/2018   CREATININE 1.55 (H) 06/05/2018   CALCIUM 9.7 06/05/2018   PROT 7.0 04/13/2018   ALBUMIN 4.1 03/31/2015   AST 19 04/13/2018   ALT 10 04/13/2018   ALKPHOS 66 03/31/2015   BILITOT 0.4 04/13/2018   GFRNONAA 51 (L) 06/05/2018   GFRAA 59 (L) 06/05/2018    Lab Results  Component Value Date   CEA1 <1.00 06/05/2018     Medications: I have reviewed the patient's current medications.   Assessment/Plan: 1. Clinical stage II Rectal cancer, posterior rectal mass identified at 10-15 cm from the anal verge, a biopsy 03/31/2014 confirmed adenocarcinoma  Endoscopic ultrasound 04/03/2014 revealed a (uT3,uN0) lesion   Initiation of radiation/Xeloda 05/05/2014, completed 06/16/2014.  Low anterior resection with diverting loop ileostomy 08/29/2014. Pathology showed invasive adenocarcinoma arising in a background of tubular adenoma invading into but not through the muscularis propria. There was no evidence of  angiolymphatic invasion or perineural invasion. There was one hyperplastic polyp with no adenomatous change or malignancy. One of 12 lymph nodes was positive for metastatic carcinoma. Resection margins were negative for atypia or malignancy. Pathologic staging pT2, pN1a.   Microsatellite stable; preserved expression of the major and minor MMR proteins.  Cycle 1 adjuvant Xeloda 09/25/2014.  Cycle 2 adjuvant Xeloda 10/16/2014  Cycle 3 adjuvant Xeloda 11/06/2014  Cycle 4 adjuvant Xeloda 11/27/2014  Cycle 5 adjuvant Xeloda 12/17/2014  Ileostomy takedown procedure 01/30/2015  CTs 03/31/2015 with subtle nodularity along the left paracolic peritoneal reflection and omentum  CT abdomen/pelvis 10/05/2015 with improved to resolved nodularity at the left abdomen and paracolic gutter, no evidence of recurrent disease  Surveillance colonoscopy 02/01/2016. Prior end to end colo-colonic anastomosis rectosigmoid colon; patent, healthy appearing mucosa; exam otherwise without abnormality. Next colonoscopy at a 3 year interval.  CTs 11/23/2016-negative for recurrent disease  CTs 11/13/2017-slight progression of inflammatory changes at the lingula, distal esophageal wall thickening, no evidence of metastatic disease 2. Hypertension.   Disposition: Mr. Hessie Knows is in clinical remission from rectal cancer.  We will follow-up on the CEA from today.  He will return for an office visit and CEA in 6 months.  We will recommend he schedule a surveillance colonoscopy.  Betsy Coder, MD  12/04/2018  10:57 AM

## 2018-12-04 NOTE — Telephone Encounter (Signed)
Printed calendar and avs.  Patient does not want the referral to be completed.

## 2018-12-05 ENCOUNTER — Telehealth: Payer: Self-pay | Admitting: *Deleted

## 2018-12-05 NOTE — Telephone Encounter (Signed)
Noted that patient had declined referral to [facility]. Called him and discussed the need for surveillance colonoscopy and he understands and agrees to the referral. Notified scheduler, Charmayne Sheer.

## 2019-01-24 ENCOUNTER — Telehealth: Payer: Self-pay | Admitting: *Deleted

## 2019-01-24 NOTE — Telephone Encounter (Signed)
Left VM with patient to follow up on status of his return to [facility] for his colonoscopy. Requested return call or Mychart response.

## 2019-06-04 ENCOUNTER — Inpatient Hospital Stay: Payer: No Typology Code available for payment source | Admitting: Nurse Practitioner

## 2019-06-04 ENCOUNTER — Inpatient Hospital Stay: Payer: No Typology Code available for payment source

## 2019-07-02 ENCOUNTER — Telehealth: Payer: Self-pay

## 2019-07-02 ENCOUNTER — Inpatient Hospital Stay: Payer: No Typology Code available for payment source

## 2019-07-02 ENCOUNTER — Telehealth: Payer: Self-pay | Admitting: Nurse Practitioner

## 2019-07-02 ENCOUNTER — Inpatient Hospital Stay: Payer: No Typology Code available for payment source | Attending: Nurse Practitioner | Admitting: Nurse Practitioner

## 2019-07-02 NOTE — Telephone Encounter (Signed)
Patient no showed this morning for doctors appointment. Per Ned Card sent schedule message to schedulers to reschedule patient for lab and doctor appointment.  Melissa X called pt to set up appt - per patient he does not want to set up appt - he says that he feels fine and colonoscopy looks okay . See's no need to come in at the moment . Reached out to patient and patient will call back [facility] to set up appointments when he feels he needs to come back in.

## 2019-07-02 NOTE — Telephone Encounter (Signed)
Called pt per 8/04 sch message.  Called pt  to set up appt - per patient he does not want to set up appt - he says that he feels fine and colonoscopy looks okay . See's no need to come in at the moment .- message sent to MD and Ned Card to let them know

## 2019-07-02 NOTE — Progress Notes (Unsigned)
Patient no showed this morning for doctors appointment. Per Ned Card sent schedule message to schedulers to reschedule patient for lab and doctor appointment.  Melissa X called pt to set up appt - per patient he does not want to set up appt - he says that he feels fine and colonoscopy looks okay . See's no need to come in at the moment . Reached out to patient and patient will call back [place] to set up appointments when he feels he needs to come back in.

## 2021-11-26 ENCOUNTER — Ambulatory Visit: Payer: 59 | Admitting: Podiatry

## 2021-11-26 ENCOUNTER — Other Ambulatory Visit: Payer: Self-pay

## 2021-11-26 DIAGNOSIS — L600 Ingrowing nail: Secondary | ICD-10-CM | POA: Diagnosis not present

## 2021-11-26 DIAGNOSIS — M79674 Pain in right toe(s): Secondary | ICD-10-CM | POA: Diagnosis not present

## 2021-11-26 DIAGNOSIS — L603 Nail dystrophy: Secondary | ICD-10-CM

## 2021-11-26 MED ORDER — CEPHALEXIN 500 MG PO CAPS: 500.0000 mg | ORAL_CAPSULE | Freq: Three times a day (TID) | ORAL | 0 refills | Status: AC

## 2021-11-26 NOTE — Patient Instructions (Signed)

## 2021-11-29 NOTE — Progress Notes (Signed)
Subjective: 56 year old male presents the office today with concerns of his right big toenail growing back thick, discolored and going to the side and he wants to have the nail removed permanently.  Is had previous nail avulsions at this point he would like for the entire nail removed permanently.  No drainage or pus.  Objective: AAO x3, NAD DP/PT pulses palpable bilaterally, CRT less than 3 seconds Right hallux toenails hypertrophic, dystrophic with brown discoloration growing to the lateral side.  Incurvation of the nail borders.  There is mild discomfort to palpation of the entire toenail.  No edema, erythema or signs of infection.  No pain with calf compression, swelling, warmth, erythema  Assessment: 56 year old male with right hallux onychodystrophy, ingrown toenail  Plan: -All treatment options discussed with the patient including all alternatives, risks, complications.  -At this time, the patient is total partial nail removal with chemical matricectomy to the right hallux. Risks and complications were discussed with the patient for which they understand and written consent was obtained. Under sterile conditions a total of 3 mL of a mixture of 2% lidocaine plain and 0.5% Marcaine plain was infiltrated in a hallux block fashion. Once anesthetized, the skin was prepped in sterile fashion. A tourniquet was then applied. Next the right hallux toenail was then excised making sure to move all nail borders. Once the nails were ensured to be removed area was debrided and the underlying skin was intact. There is no purulence identified in the procedure. Next phenol was then applied under standard conditions and copiously irrigated. Silvadene was applied. A dry sterile dressing was applied. After application of the dressing the tourniquet was removed and there is found to be an immediate capillary refill time to the digit. The patient tolerated the procedure well any complications. Post procedure  instructions were discussed the patient for which he verbally understood. Follow-up in one week for nail check or sooner if any problems are to arise. Discussed signs/symptoms of infection and directed to call the office immediately should any occur or go directly to the emergency room. In the meantime, encouraged to call the office with any questions, concerns, changes symptoms. -Keflex -Patient encouraged to call the office with any questions, concerns, change in symptoms.   Trula Slade DPM

## 2023-06-07 ENCOUNTER — Ambulatory Visit
Admission: RE | Admit: 2023-06-07 | Discharge: 2023-06-07 | Disposition: A | Discharge status: Home / Self Care | Payer: 59 | Source: Ambulatory Visit | Attending: Internal Medicine | Admitting: Internal Medicine

## 2023-06-07 VITALS — BP 128/87 | HR 65 | Temp 97.5°F | Resp 18

## 2023-06-07 DIAGNOSIS — I10 Essential (primary) hypertension: Secondary | ICD-10-CM | POA: Diagnosis not present

## 2023-06-07 DIAGNOSIS — Z5181 Encounter for therapeutic drug level monitoring: Secondary | ICD-10-CM

## 2023-06-07 MED ORDER — LISINOPRIL 10 MG PO TABS: 10.0000 mg | ORAL_TABLET | Freq: Every morning | ORAL | 0 refills | Status: AC

## 2023-06-07 NOTE — ED Triage Notes (Signed)
Pt presents to UC for a medication refill. States he takes Indapamide 1.25 mg tablet once a day and Lisinopril 10 mg q morning.

## 2023-06-07 NOTE — ED Provider Notes (Signed)
[facility]    CSN: [id number] Arrival date & time: 06/07/23  0810      History   Chief Complaint Chief Complaint  Patient presents with  . Medication Refill    HPI Darren Hill is a 57 y.o. male.   Patient presents to urgent care for blood pressure medication refill.  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 Asthma   . CHEST PAIN 06/28/2010   Qualifier: Diagnosis of  By: Eden Emms, MD, Harrington Challenger   . Colon cancer (HCC) 04/03/14   rectal mass, colon ca  . GERD (gastroesophageal reflux disease)   . Hx of radiation therapy 05/06/14-06/16/14   rectum  . Hypertension   . Palpitations 06/28/2010   Qualifier: Diagnosis of  By: Eden Emms, MD, Harrington Challenger     Patient Active Problem List   Diagnosis Date Noted  . Occult blood in stools 06/19/2018  . Polyp of colon 07/19/2015  . Fecal diversion s/p ileostomy takedown 01/30/2015 01/30/2015  . Hypertension   . GERD (gastroesophageal reflux disease)   . Rectal cancer [id number] - s/p LAR resection 08/29/2014 04/10/2014  . Elevated serum creatinine 10/29/2012  . ABNORMAL ELECTROCARDIOGRAM 06/28/2010    Past Surgical History:  Procedure Laterality Date  . COLON BIOPSY N/A 04/03/14   rectal mass=adenocarcinoma  . DIVERTING ILEOSTOMY  08/29/14  . EUS N/A 04/03/2014   Procedure: LOWER ENDOSCOPIC ULTRASOUND (EUS);  Surgeon: Willis Modena, MD;  Location: [facility];  Service: Endoscopy;  Laterality: N/A;  H/P in file cabinet   . ILEOSTOMY CLOSURE N/A 01/30/2015   Procedure: TAKEDOWN OF LOOP ILEOSTOMY, EXAM UNDER ANESTHESIA;  Surgeon: Karie Soda, MD;  Location: [facility] ORS;  Service:  General;  Laterality: N/A;       Home Medications    Prior to Admission medications   Medication Sig Start Date End Date Taking? Authorizing Provider  cephALEXin (KEFLEX) 500 MG capsule Take 1 capsule (500 mg total) by mouth 3 (three) times daily. 11/26/21   Vivi Barrack, DPM  indapamide (LOZOL) 1.25 MG tablet indapamide 1.25 mg tablet  Take 1 tablet every day by oral route for 90 days.    [provider]  lisinopril (PRINIVIL,ZESTRIL) 10 MG tablet Take 10 mg by mouth every morning.     [provider]  lisinopril (ZESTRIL) 10 MG tablet lisinopril 10 mg tablet  Take 1 tablet every day by oral route for 90 days. 03/20/14   [provider]  polyethylene glycol-electrolytes (GAVILYTE-N WITH FLAVOR PACK) 420 g solution GaviLyte-N 420 gram oral solution  TAKE AS DIRECTED    [provider]  sildenafil (VIAGRA) 100 MG tablet sildenafil 100 mg tablet  TAKE 1 TABLET BY MOUTH ONCE DAILY AS DIRECTED FOR 30 DAYS    [provider]  sildenafil (VIAGRA) 50 MG tablet TAKE 1 2 TABLETS BY MOUTH 30 60 MIN BEFORE INTERCOURSE. DO NOT TAKE WITH A FATTY MEAL 06/02/18   [provider]  terbinafine (LAMISIL) 250 MG tablet Take 1 tablet (250 mg total) by mouth daily. 03/01/18   Vivi Barrack, DPM    Family  History Family History  Problem Relation Age of Onset  . Brain cancer Maternal Aunt        not sure if tumor did operation on brain    Social History Social History   Tobacco Use  . Smoking status: Former    Packs/day: 0.25    Years: 15.00    Additional pack years: 0.00    Total pack years: 3.75    Types: Cigarettes    Quit date: 04/03/2006    Years since quitting: 17.1  . Smokeless tobacco: Never  Substance Use Topics  . Alcohol use: No  . Drug use: No     Allergies   Patient has no known allergies.   Review of Systems Review of Systems   Physical Exam Triage Vital Signs ED Triage Vitals [06/07/23 0823]  Enc Vitals  Group     BP 128/87     Pulse Rate 65     Resp 18     Temp (!) 97.5 F (36.4 C)     Temp Source Oral     SpO2 97 %     Weight      Height      Head Circumference      Peak Flow      Pain Score 0     Pain Loc      Pain Edu?      Excl. in GC?    No data found.  Updated Vital Signs BP 128/87 (BP Location: Right Arm)   Pulse 65   Temp (!) 97.5 F (36.4 C) (Oral)   Resp 18   SpO2 97%   Visual Acuity Right Eye Distance:   Left Eye Distance:   Bilateral Distance:    Right Eye Near:   Left Eye Near:    Bilateral Near:     Physical Exam   UC Treatments / Results  Labs (all labs ordered are listed, but only abnormal results are displayed) Labs Reviewed - No data to display  EKG   Radiology No results found.  Procedures Procedures (including critical care time)  Medications Ordered in UC Medications - No data to display  Initial Impression / Assessment and Plan / UC Course  I have reviewed the triage vital signs and the nursing notes.  Pertinent labs & imaging results that were available during my care of the patient were reviewed by me and considered in my medical decision making (see chart for details).     *** Final Clinical Impressions(s) / UC Diagnoses   Final diagnoses:  None   Discharge Instructions   None    ED Prescriptions   None    PDMP not reviewed this encounter.

## 2023-06-07 NOTE — Discharge Instructions (Addendum)
Take medicine daily as directed. I have sent in a refill for your lisinopril 10mg  once daily for 30 day supply. I do blood work in the clinic to make sure that your kidney function is okay since you have been taking this medicine for long-term and have not had your blood work checked in a year.  Please call your primary care provider at the phone number listed on your paperwork to schedule an appointment for a yearly physical.  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  At your primary care office, they will be able to provide you further refills to manage your blood pressure medication long-term.  Return to urgent care as needed.

## 2023-06-08 LAB — BASIC METABOLIC PANEL
BUN/Creatinine Ratio: 8 — ABNORMAL LOW (ref 9–20)
BUN: 11 mg/dL (ref 6–24)
CO2: 25 mmol/L (ref 20–29)
Calcium: 9.8 mg/dL (ref 8.7–10.2)
Chloride: 99 mmol/L (ref 96–106)
Creatinine, Ser: 1.38 mg/dL — ABNORMAL HIGH (ref 0.76–1.27)
Glucose: 109 mg/dL — ABNORMAL HIGH (ref 70–99)
Potassium: 4.4 mmol/L (ref 3.5–5.2)
Sodium: 139 mmol/L (ref 134–144)
eGFR: 60 mL/min/{1.73_m2} (ref >59)

## 2023-06-08 LAB — CBC
Hematocrit: 41.7 % (ref 37.5–51.0)
Hemoglobin: 14.1 g/dL (ref 13.0–17.7)
MCH: 29.1 pg (ref 26.6–33.0)
MCHC: 33.8 g/dL (ref 31.5–35.7)
MCV: 86 fL (ref 79–97)
Platelets: 314 10*3/uL (ref 150–450)
RBC: 4.84 x10E6/uL (ref 4.14–5.80)
RDW: 13 % (ref 11.6–15.4)
WBC: 5.4 10*3/uL (ref 3.4–10.8)
